# Patient Record
Sex: Female | Born: 1960 | Race: Black or African American | Hispanic: No | Marital: Single | Smoking: Current every day smoker
Health system: Southern US, Community
[De-identification: ages and names within clinical notes are randomized; demographics above are authoritative.]

## PROBLEM LIST (undated history)

## (undated) ENCOUNTER — Emergency Department: Source: Home / Self Care

---

## 2002-12-05 ENCOUNTER — Emergency Department (HOSPITAL_COMMUNITY): Admission: EM | Admit: 2002-12-05 | Discharge: 2002-12-05 | Payer: Self-pay | Admitting: Emergency Medicine

## 2002-12-11 ENCOUNTER — Encounter: Admission: RE | Admit: 2002-12-11 | Discharge: 2002-12-11 | Payer: Self-pay | Admitting: Obstetrics and Gynecology

## 2002-12-12 ENCOUNTER — Encounter (INDEPENDENT_AMBULATORY_CARE_PROVIDER_SITE_OTHER): Payer: Self-pay

## 2002-12-12 ENCOUNTER — Ambulatory Visit (HOSPITAL_COMMUNITY): Admission: AD | Admit: 2002-12-12 | Discharge: 2002-12-12 | Payer: Self-pay | Admitting: Obstetrics and Gynecology

## 2002-12-27 ENCOUNTER — Encounter: Admission: RE | Admit: 2002-12-27 | Discharge: 2002-12-27 | Payer: Self-pay | Admitting: Obstetrics and Gynecology

## 2006-09-21 ENCOUNTER — Emergency Department (HOSPITAL_COMMUNITY): Admission: EM | Admit: 2006-09-21 | Discharge: 2006-09-21 | Payer: Self-pay | Admitting: Emergency Medicine

## 2008-03-28 ENCOUNTER — Ambulatory Visit: Payer: Self-pay | Admitting: Internal Medicine

## 2008-03-29 ENCOUNTER — Ambulatory Visit: Payer: Self-pay | Admitting: *Deleted

## 2010-01-14 ENCOUNTER — Ambulatory Visit: Payer: Self-pay | Admitting: Obstetrics & Gynecology

## 2010-01-14 ENCOUNTER — Other Ambulatory Visit: Admission: RE | Admit: 2010-01-14 | Discharge: 2010-01-14 | Payer: Self-pay | Admitting: Obstetrics & Gynecology

## 2010-01-14 LAB — CONVERTED CEMR LAB
HCT: 45.1 % (ref 36.0–46.0)
Hemoglobin: 14.8 g/dL (ref 12.0–15.0)
MCHC: 32.8 g/dL (ref 30.0–36.0)
RBC: 4.86 M/uL (ref 3.87–5.11)
RDW: 14.4 % (ref 11.5–15.5)
TSH: 0.46 microintl units/mL (ref 0.350–4.500)

## 2010-01-15 ENCOUNTER — Ambulatory Visit: Payer: Self-pay | Admitting: Internal Medicine

## 2010-01-21 ENCOUNTER — Ambulatory Visit (HOSPITAL_COMMUNITY): Admission: RE | Admit: 2010-01-21 | Discharge: 2010-01-21 | Payer: Self-pay | Admitting: Obstetrics & Gynecology

## 2010-01-22 ENCOUNTER — Ambulatory Visit: Payer: Self-pay | Admitting: Internal Medicine

## 2010-01-28 ENCOUNTER — Ambulatory Visit: Payer: Self-pay | Admitting: Internal Medicine

## 2010-02-11 ENCOUNTER — Ambulatory Visit (HOSPITAL_COMMUNITY): Admission: RE | Admit: 2010-02-11 | Discharge: 2010-02-11 | Payer: Self-pay | Admitting: Family Medicine

## 2010-02-11 ENCOUNTER — Ambulatory Visit: Payer: Self-pay | Admitting: Internal Medicine

## 2010-02-11 ENCOUNTER — Encounter (INDEPENDENT_AMBULATORY_CARE_PROVIDER_SITE_OTHER): Payer: Self-pay | Admitting: Family Medicine

## 2010-02-11 LAB — CONVERTED CEMR LAB
Amphetamine Screen, Ur: NEGATIVE
Benzodiazepines.: NEGATIVE
Creatinine,U: 66.2 mg/dL
Marijuana Metabolite: NEGATIVE
Methadone: NEGATIVE
Phencyclidine (PCP): NEGATIVE

## 2010-04-14 ENCOUNTER — Ambulatory Visit: Payer: Self-pay | Admitting: Internal Medicine

## 2010-11-04 ENCOUNTER — Inpatient Hospital Stay (INDEPENDENT_AMBULATORY_CARE_PROVIDER_SITE_OTHER)
Admission: RE | Admit: 2010-11-04 | Discharge: 2010-11-04 | Disposition: A | Discharge status: Home / Self Care | Payer: Self-pay | Source: Ambulatory Visit | Attending: Emergency Medicine | Admitting: Emergency Medicine

## 2010-11-04 DIAGNOSIS — I10 Essential (primary) hypertension: Secondary | ICD-10-CM

## 2010-11-12 ENCOUNTER — Inpatient Hospital Stay (INDEPENDENT_AMBULATORY_CARE_PROVIDER_SITE_OTHER)
Admission: RE | Admit: 2010-11-12 | Discharge: 2010-11-12 | Disposition: A | Discharge status: Home / Self Care | Payer: Self-pay | Source: Ambulatory Visit

## 2010-11-12 DIAGNOSIS — I10 Essential (primary) hypertension: Secondary | ICD-10-CM

## 2010-11-12 DIAGNOSIS — Z760 Encounter for issue of repeat prescription: Secondary | ICD-10-CM

## 2010-11-12 LAB — POCT I-STAT, CHEM 8
BUN: 3 mg/dL — ABNORMAL LOW (ref 6–23)
Chloride: 103 mEq/L (ref 96–112)
Creatinine, Ser: 0.9 mg/dL (ref 0.4–1.2)
Glucose, Bld: 106 mg/dL — ABNORMAL HIGH (ref 70–99)
HCT: 49 % — ABNORMAL HIGH (ref 36.0–46.0)
Hemoglobin: 16.7 g/dL — ABNORMAL HIGH (ref 12.0–15.0)
Potassium: 4.4 mEq/L (ref 3.5–5.1)
Sodium: 139 mEq/L (ref 135–145)
TCO2: 25 mmol/L (ref 0–100)

## 2011-02-05 NOTE — Op Note (Signed)
Monique Key, Monique Key                         ACCOUNT NO.:  [id number]   MEDICAL RECORD NO.:  [id number]                   PATIENT TYPE:  AMB   LOCATION:  [facility]                                 FACILITY:  [facility]   PHYSICIAN:  Phil D. Okey Dupre, M.D.                  DATE OF BIRTH:  10/15/1960   DATE OF PROCEDURE:  12/12/2002  DATE OF DISCHARGE:                                 OPERATIVE REPORT   PROCEDURE:  1. Submucous myomectomy.  2. Uterine curettage.  3. Hysteroscopy.   PREOPERATIVE DIAGNOSES:  Aborting submucous fibroid.   POSTOPERATIVE DIAGNOSES:  Aborting submucous fibroid pending pathology  report.   SURGEON:  Javier Glazier. Okey Dupre, M.D.   OPERATIVE FINDINGS:  A large 4 x 5 x 4 cm aborting fibroid three-quarters  outside of a dilated cervix with a 1 cm pedicle that extended up to the left  lateral uterine wall approximately half-way up towards the fundus.   PROCEDURE:  Under satisfactory general anesthesia with patient in dorsal  lithotomy position, the perineum and vagina prepped and draped in the usual  sterile manner.  Bimanual pelvic examination under anesthesia revealed the  aborting fibroid and a finger could be brought around the entire  circumference to see that the cervix was dilated to about 5 cm.  A weighted  speculum was placed in the posterior fourchette of the vagina and the  aborting fibroid was grasped with a single tooth tenaculum.  Traction was  placed on it.  With that the pedicle could be easily palpated.  Tonsil clamp  was used to clamp the pedicle and the fibroid was then by sharp dissection  removed from the pedicle.  It was soft in consistency and felt degenerated.  When this was done, the __________ was placed on the pedicle and that is how  we found by digital examination where the pedicle implanted.  Pedicle was  then twisted off and the uterine cavity explored with the polyp forceps  followed by exploration with the ring forceps after the uterine cavity  had  been sounded to 8 cm in depth.  The uterine cavity was then vigorously  curetted around the entire circumference with a large sharp curette followed  by a small serrated curette and significant amount of endometrial tissue was  obtained.  Attempt was then made to examine the uterine cavity with a  hysteroscope.  The fundus could be easily seen but because the cervix was  dilated and the uterine cavity dilated so much, it was very difficult to get  enough fluid in there using normal saline to be able to visualize much of  anything else as there was a significant amount of bleeding once we got down  a couple centimeters from the apex.  Vigorous curettage was then continued  until there was very little bleeding coming from the uterine cavity by the  end of the procedure.  The  patient was then transferred to recovery room in  satisfactory condition after the speculum was removed from the vagina.  Specimens sent to pathology was the leiomyomata that was removed and the  uterine curettings.                                               Phil D. Okey Dupre, M.D.    PDR/MEDQ  D:  12/12/2002  T:  12/12/2002  Job:  [id number]

## 2011-03-10 ENCOUNTER — Emergency Department (HOSPITAL_COMMUNITY)
Admission: EM | Admit: 2011-03-10 | Discharge: 2011-03-10 | Disposition: A | Discharge status: Home / Self Care | Payer: Medicaid Other | Attending: Emergency Medicine | Admitting: Emergency Medicine

## 2011-03-10 DIAGNOSIS — J3489 Other specified disorders of nose and nasal sinuses: Secondary | ICD-10-CM | POA: Insufficient documentation

## 2011-03-10 DIAGNOSIS — F411 Generalized anxiety disorder: Secondary | ICD-10-CM | POA: Insufficient documentation

## 2011-03-10 DIAGNOSIS — K047 Periapical abscess without sinus: Secondary | ICD-10-CM | POA: Insufficient documentation

## 2011-03-10 DIAGNOSIS — K089 Disorder of teeth and supporting structures, unspecified: Secondary | ICD-10-CM | POA: Insufficient documentation

## 2011-03-10 DIAGNOSIS — R079 Chest pain, unspecified: Secondary | ICD-10-CM | POA: Insufficient documentation

## 2011-03-10 DIAGNOSIS — I10 Essential (primary) hypertension: Secondary | ICD-10-CM | POA: Insufficient documentation

## 2011-03-10 DIAGNOSIS — K029 Dental caries, unspecified: Secondary | ICD-10-CM | POA: Insufficient documentation

## 2012-03-27 IMAGING — US US PELVIS COMPLETE MODIFY
1 series · 14 of 25 positions shown · non-contrast
Comparison: None.

CLINICAL DATA: Dysfunctional uterine bleeding.  Fibroids, prior
myomectomy.

TRANSABDOMINAL AND TRANSVAGINAL ULTRASOUND OF PELVIS
TECHNIQUE: Both transabdominal and transvaginal ultrasound
examinations of the pelvis were performed including evaluation of
the uterus, ovaries, adnexal regions, and pelvic cul-de-sac.

[Series 1: us pelvis complete modify · 0.24mm/px · 14 of 62 slices shown]
[im 1/62]
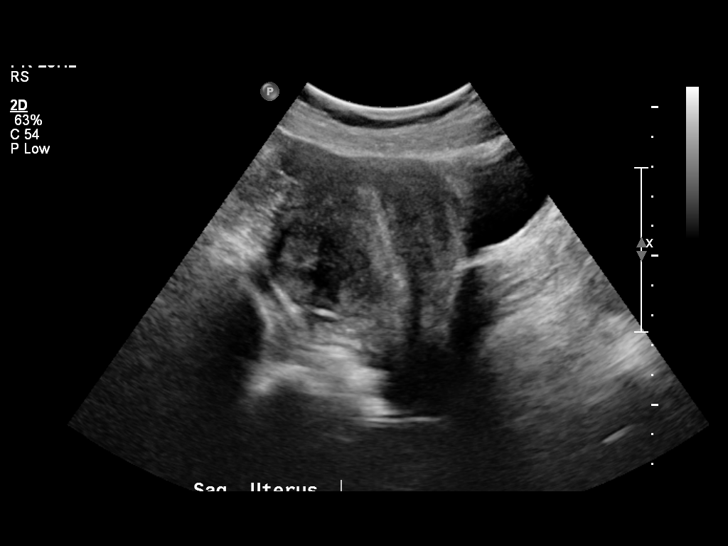
[im 6/62]
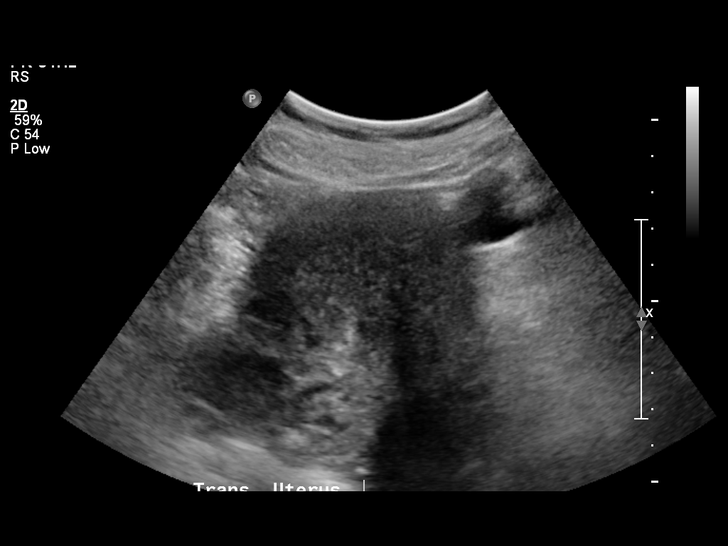
[im 11/62]
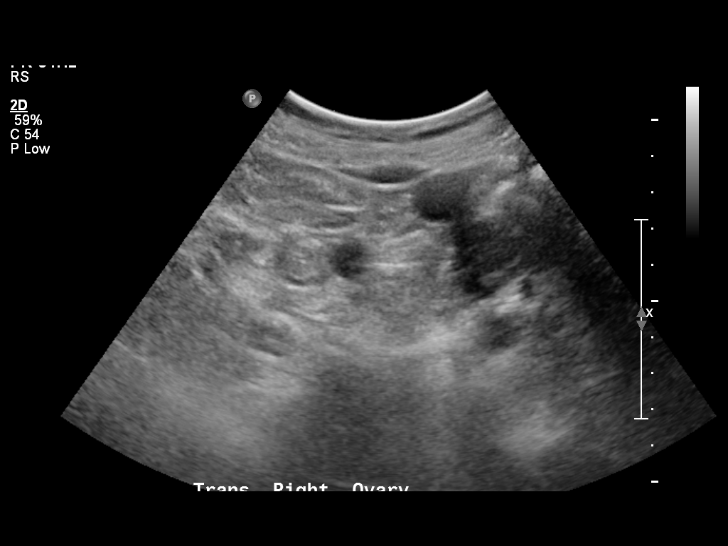
[im 16/62]
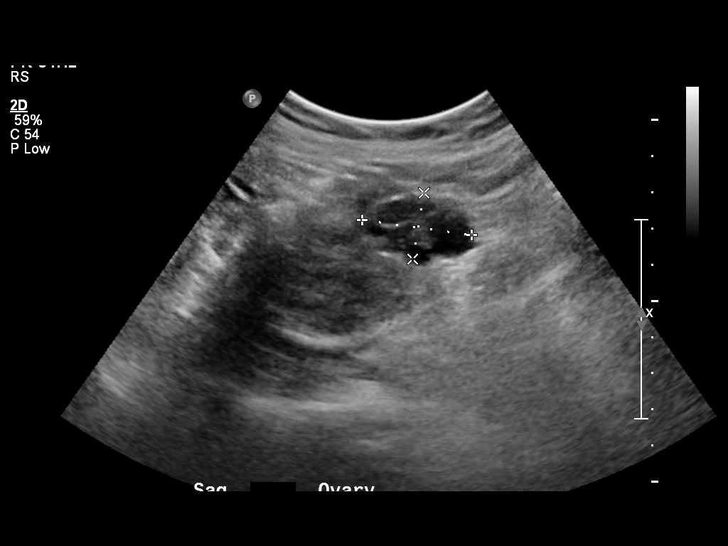
[im 21/62]
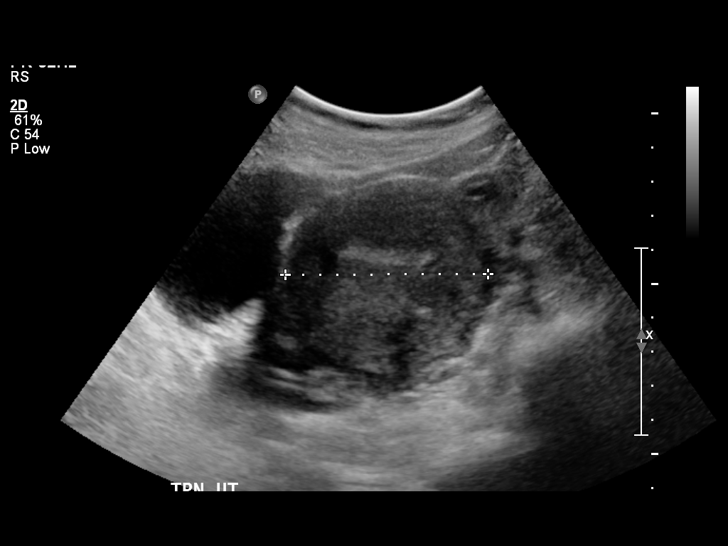
[im 23/62]
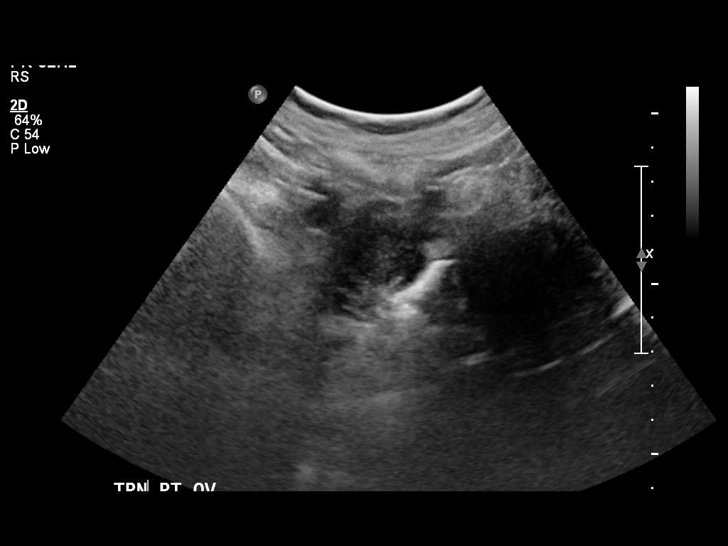
[im 28/62]
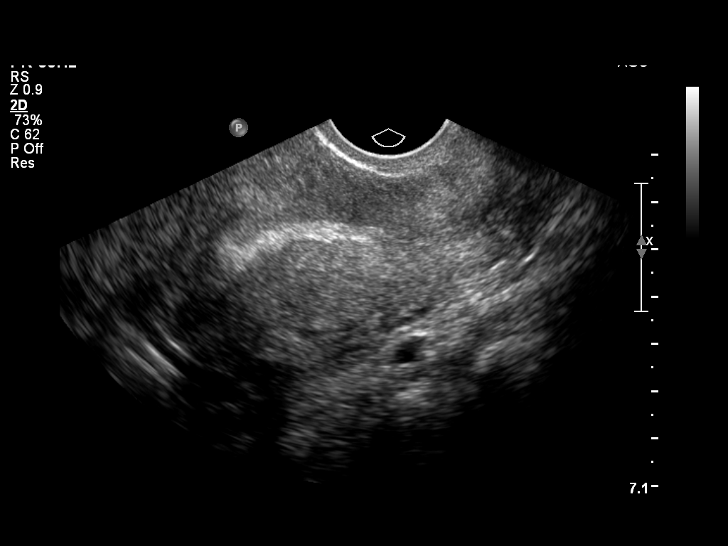
[im 34/62]
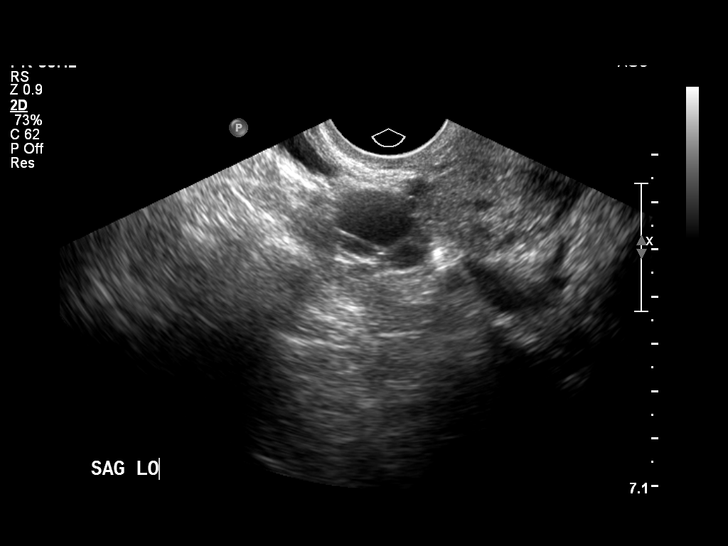
[im 39/62]
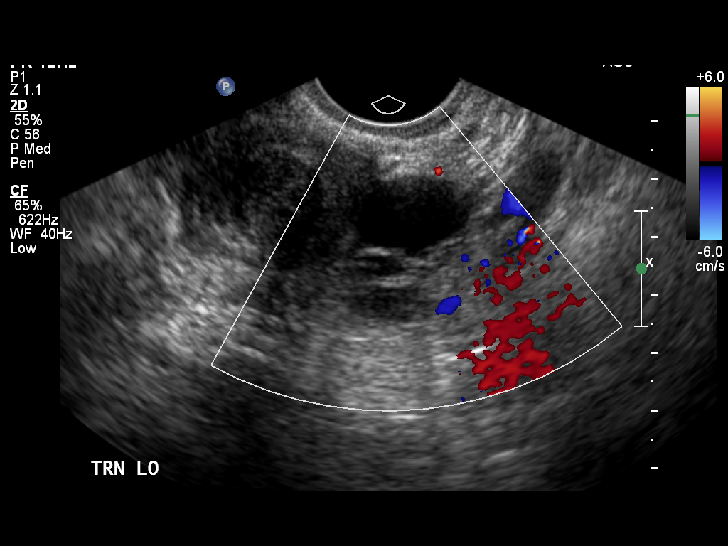
[im 41/62]
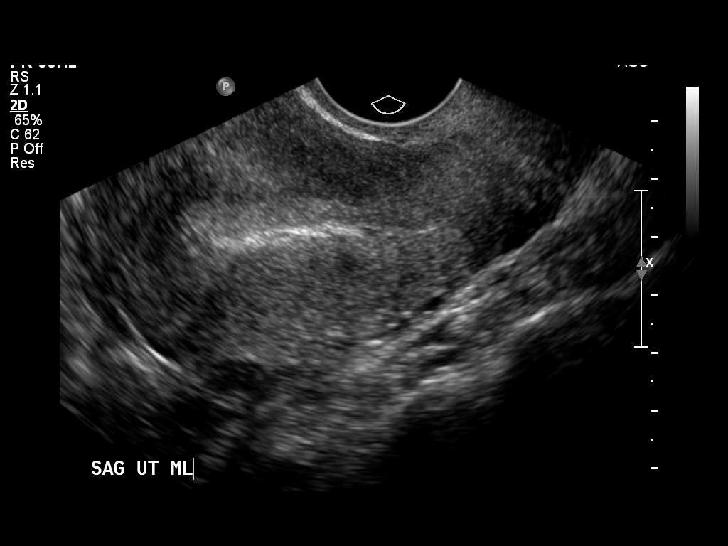
[im 46/62]
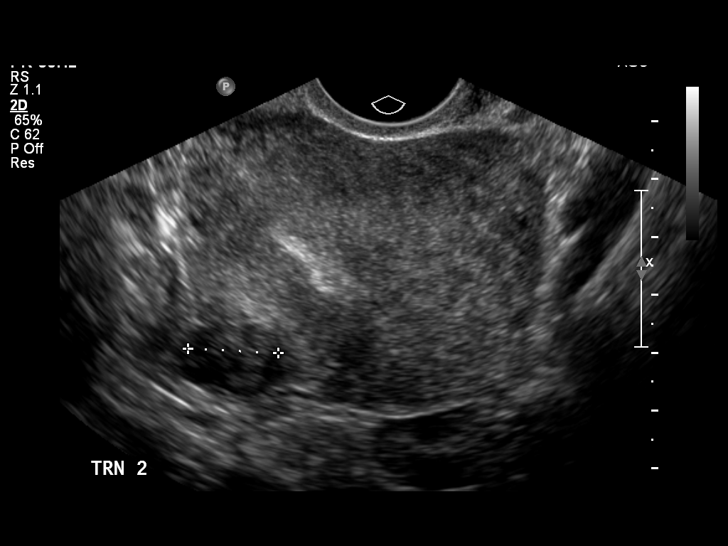
[im 51/62]
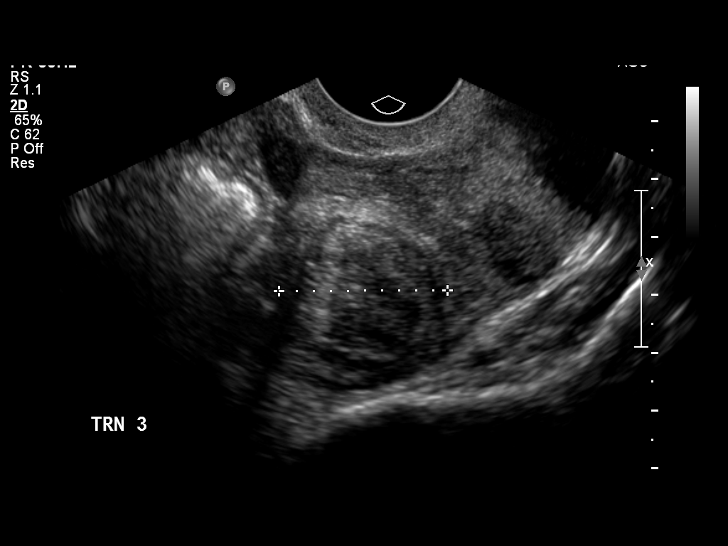
[im 56/62]
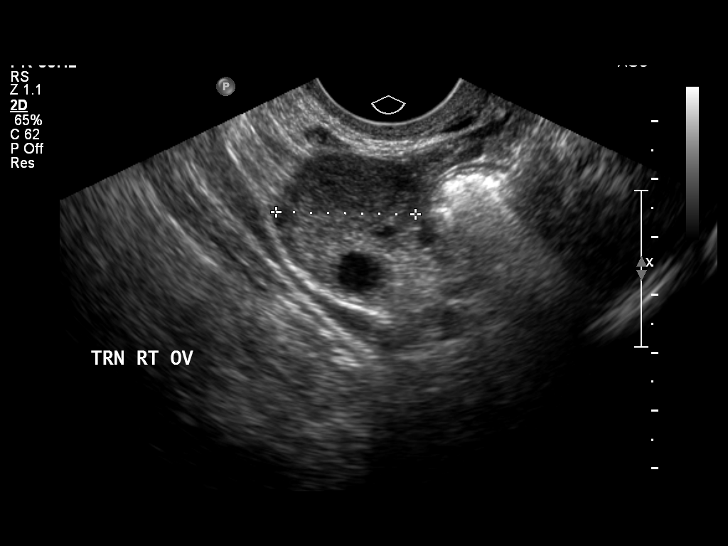
[im 62/62]
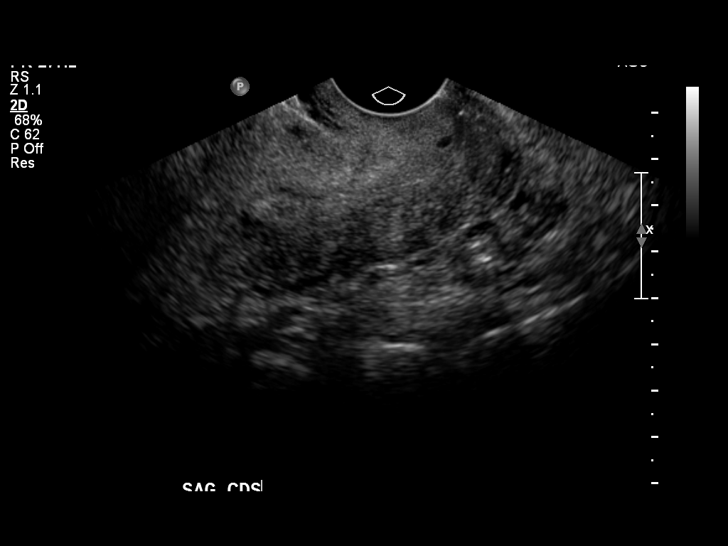

[14 of 25 positions shown; findings below may reference images not displayed]

FINDINGS: Uterus 9.5 x 5.9 x 5.6 cm.  Anteverted, anteflexed.  Probable
fibroid noted as follows:

Left lateral uterine fundus, partly subserosal / partly intramural,
1.8 x 1.8 x 1.5 cm

Right posterior uterine fundus, partly subserosal / partly
intramural, 1.6 x 1.3 x 1.2 cm

Right lateral uterine body, partly intramural, displacing the
endometrial stripe anteriorly, 3.4 x 2.9 x 2.8 cm.

Endometrium 8 mm, uniformly echogenic where visualized.

Right Ovary 3.5 x 2.8 x 2.4 cm.  Normal.

Left Ovary 2.5 x 1.9 x 1.6 cm.  Normal.

Other Findings:  No free fluid.
IMPRESSION: Fibroid uterus as above.  Largest in the right posterolateral
uterine body displaces the endometrial stripe anteriorly.

## 2021-01-17 DIAGNOSIS — F1020 Alcohol dependence, uncomplicated: Secondary | ICD-10-CM | POA: Insufficient documentation

## 2021-01-19 DIAGNOSIS — N3000 Acute cystitis without hematuria: Secondary | ICD-10-CM | POA: Insufficient documentation

## 2021-01-19 DIAGNOSIS — F1210 Cannabis abuse, uncomplicated: Secondary | ICD-10-CM | POA: Insufficient documentation

## 2021-01-19 DIAGNOSIS — F1010 Alcohol abuse, uncomplicated: Secondary | ICD-10-CM | POA: Insufficient documentation

## 2021-01-19 DIAGNOSIS — F1420 Cocaine dependence, uncomplicated: Secondary | ICD-10-CM | POA: Insufficient documentation

## 2022-02-23 ENCOUNTER — Ambulatory Visit (INDEPENDENT_AMBULATORY_CARE_PROVIDER_SITE_OTHER): Payer: Medicaid Other | Admitting: Podiatry

## 2022-02-23 ENCOUNTER — Ambulatory Visit (INDEPENDENT_AMBULATORY_CARE_PROVIDER_SITE_OTHER): Payer: Medicaid Other

## 2022-02-23 ENCOUNTER — Encounter: Payer: Self-pay | Admitting: Podiatry

## 2022-02-23 DIAGNOSIS — M722 Plantar fascial fibromatosis: Secondary | ICD-10-CM | POA: Diagnosis not present

## 2022-02-23 NOTE — Progress Notes (Signed)
  Subjective:  Patient ID: Monique Key, female    DOB: 1961-09-12,  MRN: [id number]  Chief Complaint  Patient presents with   Foot Pain    I have some lumps on both the arches of my feet    61 y.o. female presents with the above complaint. History confirmed with patient.   Objective:  Physical Exam: warm, good capillary refill, no trophic changes or ulcerative lesions, normal DP and PT pulses, normal sensory exam, and bilaterally she has golf ball sized plantar fibromas in the medial band of the plantar fascia of both feet.   Radiographs: Multiple views x-ray of both feet: no fracture, dislocation, swelling or degenerative changes noted Assessment:   1. Plantar fibromatosis      Plan:  Patient was evaluated and treated and all questions answered.  Discussed etiology treatment options of plantar fibromatosis including injection therapy and excision.  We discussed the risk of recurrence with either treatment option.  I recommended corticosteroid injection.  Today following sterile prep with alcohol the bilateral fibromas were injected with 1 cc of dexamethasone phosphate 0.5 cc each of 2% lidocaine and 0.5% Marcaine plain.  She tolerated the procedures well.  I will see her back as needed cautioned on the risk of recurrence we could discuss further injections or surgical resection at that point.  Return if symptoms worsen or fail to improve.

## 2022-02-25 ENCOUNTER — Telehealth: Payer: Self-pay | Admitting: *Deleted

## 2022-02-25 NOTE — Telephone Encounter (Addendum)
Patient is calling for recommendations on inserts that medicaid would pay, cannot afford any out of pocket expenses. She is also wanting detail summary of the visit. She will pick up at front desk.

## 2022-02-25 NOTE — Telephone Encounter (Signed)
We discussed earlier but Superfeet, Powerstep and ProTalus are all good brands

## 2022-02-26 NOTE — Telephone Encounter (Signed)
Called patient giving her orthotics recommendation by physician, verbalized understanding but wanted recommended orthotics mailed to her address along with last visit summary.  Mailed on 02/26/22.

## 2022-04-13 ENCOUNTER — Ambulatory Visit (INDEPENDENT_AMBULATORY_CARE_PROVIDER_SITE_OTHER): Payer: Medicaid Other | Admitting: Podiatry

## 2022-04-13 DIAGNOSIS — Z720 Tobacco use: Secondary | ICD-10-CM | POA: Diagnosis not present

## 2022-04-13 DIAGNOSIS — M722 Plantar fascial fibromatosis: Secondary | ICD-10-CM

## 2022-04-13 NOTE — Progress Notes (Signed)
  Subjective:  Patient ID: Monique Key, female    DOB: February 22, 1961,  MRN: [id number]  Chief Complaint  Patient presents with   Foot Problem     WANTS SURGERY ON BILATERAL FEET. PATIENT STATED FEELS LIKE KNIVES WHEN WALKING. pAIN IS LOCATED AT THE BOTTOM OF FEET     61 y.o. female presents with the above complaint. History confirmed with patient.  Injection was not helpful only lasted for a day or 2 and then the pain returned  Objective:  Physical Exam: warm, good capillary refill, no trophic changes or ulcerative lesions, normal DP and PT pulses, normal sensory exam, and bilaterally she has golf ball sized plantar fibromas in the medial band of the plantar fascia of both feet.   Radiographs: Multiple views x-ray of both feet: no fracture, dislocation, swelling or degenerative changes noted Assessment:   1. Plantar fibromatosis   2. Tobacco use       Plan:  Patient was evaluated and treated and all questions answered.  We can discuss further treatment options.  At this point injection therapy has not helped.  We discussed surgical excision.  We discussed the risk benefits and potential complications of the procedure including not limited to  pain, swelling, infection, scar, numbness which may be temporary or permanent, chronic pain, stiffness, nerve pain or damage, wound healing problems.  All questions were addressed.  Informed consent was signed and reviewed.  No guarantees to the outcome of surgery were made.  I did recommend an MRI for surgical planning for the fibroma excisions prior to surgery.  This was ordered from [facility] imaging and she will schedule..  She does smoke approximately 5 cigarettes a day we discussed the impact of smoking on wound healing and the possibility of a thickened hypertrophic painful scar or wound dehiscence.  She will reduce and try to cease her smoking after surgery.  I discussed with her these risks and again and she will work on  this.    Surgical plan:  Procedure: -Fibroma excision left foot  Location: -[facility]  Anesthesia plan: -IV sedation with local block  Postoperative pain plan: - Tylenol 1000 mg every 6 hours, ibuprofen 600 mg every 6 hours, gabapentin 300 mg every 8 hours x5 days, oxycodone 5 mg 1-2 tabs every 6 hours only as needed  DVT prophylaxis: -None required  WB Restrictions / DME needs: -NWB in CAM boot   No follow-ups on file.

## 2022-04-13 NOTE — Patient Instructions (Signed)
Call [facility] to schedule your MRI at the below locations.  Please allow at least 1 business day after your visit to process the referral.  It may take longer depending on approval from insurance.  Please let me know if you have issues or problems scheduling the MRI     [facility] [place] [phone] [address] [place], [place] [address]  

## 2022-04-15 ENCOUNTER — Telehealth: Payer: Self-pay | Admitting: Urology

## 2022-04-15 NOTE — Telephone Encounter (Signed)
DOS - 04/23/22  PLANTAR FIBROMA LEFT --- [id number]  [employer] EFFECTIVE DATE - 03/20/22  PLAN DEDUCTIBLE - $0.00 OUT OF POCKET - Member's individual out-of-pocket maximum has no limit. COINSURANCE - 0% COPAY - $0.00  PER [facility] SITE FOR CPT CODE 71855 HAS BEEN APPROVED, AUTH # [id number], GOOD FROM 04/23/22 - 07/22/22.

## 2022-04-21 ENCOUNTER — Ambulatory Visit
Admission: RE | Admit: 2022-04-21 | Discharge: 2022-04-21 | Disposition: A | Discharge status: Home / Self Care | Payer: Medicaid Other | Source: Ambulatory Visit | Attending: Podiatry | Admitting: Podiatry

## 2022-04-21 DIAGNOSIS — M722 Plantar fascial fibromatosis: Secondary | ICD-10-CM

## 2022-04-21 MED ORDER — DIPHENHYDRAMINE HCL 50 MG/ML IJ SOLN
25.0000 mg | Freq: Once | INTRAMUSCULAR | Status: AC
  Administered 2022-04-21: 25 mg via INTRAVENOUS

## 2022-04-21 MED ORDER — GADOBENATE DIMEGLUMINE 529 MG/ML IV SOLN
15.0000 mL | Freq: Once | INTRAVENOUS | Status: AC | PRN
  Administered 2022-04-21: 15 mL via INTRAVENOUS

## 2022-04-21 MED ORDER — DIPHENHYDRAMINE HCL 25 MG PO CAPS
25.0000 mg | ORAL_CAPSULE | Freq: Once | ORAL | Status: AC
Start: 2022-04-21 — End: 2022-04-21
  Administered 2022-04-21: 25 mg via ORAL

## 2022-04-21 NOTE — Progress Notes (Signed)
Pt was having MRI scan and reported severe itching after contrast administration. MRI tech notified Dr. Karin Golden who gave the pt 25 mg oral benadryl. Pt continued scan and post scan reported she was still itching. Pt was brought over to the nurses station, Dr. Karin Golden notified of the patient continuing to itch and ordered an additional 25 mg mg of IV benadryl to be given. 3 minutes after IV benadryl was given the patient stated "I have to go", "my ride is here". I advised the patient that we would like to monitor her to ensure the reaction got better and no worse, pt insisted on leaving. Explained to the patient if her symptoms do not improve or they get worse she needs to seek urgent medical attention, pt verbalized understanding. Dr Karin Golden was notified.

## 2022-04-23 ENCOUNTER — Other Ambulatory Visit: Payer: Self-pay | Admitting: Podiatry

## 2022-04-23 DIAGNOSIS — M722 Plantar fascial fibromatosis: Secondary | ICD-10-CM

## 2022-04-23 MED ORDER — HYDROCODONE-ACETAMINOPHEN 5-325 MG PO TABS: 1.0000 | ORAL_TABLET | ORAL | 0 refills | Status: DC | PRN

## 2022-04-23 NOTE — Progress Notes (Signed)
04/23/22 fibroma excision

## 2022-04-24 ENCOUNTER — Other Ambulatory Visit: Payer: Self-pay | Admitting: Podiatry

## 2022-04-24 MED ORDER — OXYCODONE-ACETAMINOPHEN 5-325 MG PO TABS: 1.0000 | ORAL_TABLET | ORAL | 0 refills | Status: DC | PRN

## 2022-04-26 ENCOUNTER — Telehealth: Payer: Self-pay

## 2022-04-26 ENCOUNTER — Telehealth: Payer: Self-pay | Admitting: Podiatry

## 2022-04-26 MED ORDER — HYDROCODONE-ACETAMINOPHEN 10-325 MG PO TABS: 1.0000 | ORAL_TABLET | ORAL | 0 refills | Status: AC | PRN

## 2022-04-26 MED ORDER — GABAPENTIN 300 MG PO CAPS: 300.0000 mg | ORAL_CAPSULE | Freq: Three times a day (TID) | ORAL | 0 refills | Status: DC

## 2022-04-26 NOTE — Telephone Encounter (Signed)
Patient called and lvm on general mailbox stating that she is in severe pain and she needs stronger medication sent to her pharmacy

## 2022-04-28 NOTE — Telephone Encounter (Signed)
Patient is aware 

## 2022-05-03 ENCOUNTER — Telehealth: Payer: Self-pay | Admitting: *Deleted

## 2022-05-03 ENCOUNTER — Ambulatory Visit (INDEPENDENT_AMBULATORY_CARE_PROVIDER_SITE_OTHER): Payer: Medicaid Other | Admitting: Podiatry

## 2022-05-03 DIAGNOSIS — M722 Plantar fascial fibromatosis: Secondary | ICD-10-CM

## 2022-05-03 MED ORDER — HYDROCODONE-ACETAMINOPHEN 10-325 MG PO TABS: 1.0000 | ORAL_TABLET | ORAL | 0 refills | Status: DC | PRN

## 2022-05-03 NOTE — Telephone Encounter (Signed)
Patient called and said that the wrong medication was sent to pharmacy, was supposed to be Oxycodone-10,not the Norco, 10-325 mg. Please advise.

## 2022-05-04 MED ORDER — OXYCODONE-ACETAMINOPHEN 10-325 MG PO TABS: 1.0000 | ORAL_TABLET | ORAL | 0 refills | Status: AC | PRN

## 2022-05-04 MED ORDER — OXYCODONE-ACETAMINOPHEN 10-325 MG PO TABS: 1.0000 | ORAL_TABLET | ORAL | 0 refills | Status: DC | PRN

## 2022-05-04 NOTE — Telephone Encounter (Signed)
Patient notified

## 2022-05-04 NOTE — Telephone Encounter (Signed)
Patient called back and stated that [facility] has been out of the Percocet for 3 months now. She asked could the Medication be sent to the [facility] on [address].   Pharmacy has been added to the chart.

## 2022-05-05 NOTE — Progress Notes (Signed)
  Subjective:  Patient ID: Monique Key, female    DOB: 1961/01/28,  MRN: [id number]  Chief Complaint  Patient presents with   Routine Post Op    POV #1 DOS 04/23/2022 REMOVAL FIBROMA LT FOOT  Patient reports pain 8 out of 10, taking Rx pain meds q4hrs    61 y.o. female returns for post-op check.  She still having quite a bit of pain says both feet hurt and itch  Review of Systems: Negative except as noted in the HPI. Denies N/V/F/Ch.   Objective:  There were no vitals filed for this visit. There is no height or weight on file to calculate BMI. Constitutional Well developed. Well nourished.  Vascular Foot warm and well perfused. Capillary refill normal to all digits.  Calf is soft and supple, no posterior calf or knee pain, negative Homans' sign  Neurologic Normal speech. Oriented to person, place, and time. Epicritic sensation to light touch grossly present bilaterally.  Dermatologic Skin healing well without signs of infection. Skin edges well coapted without signs of infection.  Orthopedic: Tenderness to palpation noted about the surgical site.    Assessment:   1. Plantar fibromatosis    Plan:  Patient was evaluated and treated and all questions answered.  S/p foot surgery left -Progressing as expected post-operatively.  Her pain seems more consistent with her peripheral neuropathy than it was needed postoperative pain as it is bilateral and nonspecific -WB Status: NWB in CAM boot -Sutures: Plan remove in 2 weeks. -Medications: Refill of pain medication sent to pharmacy -Foot redressed.  No follow-ups on file.

## 2022-05-10 ENCOUNTER — Telehealth: Payer: Self-pay | Admitting: Podiatry

## 2022-05-10 NOTE — Telephone Encounter (Signed)
Patient called and stated she is in severe pain since her sx. Her pain medication is not even touching the pain. She states she is taking the pain meds 4 times a day and she has not been on her feet since sx.

## 2022-05-11 MED ORDER — GABAPENTIN 300 MG PO CAPS: 300.0000 mg | ORAL_CAPSULE | Freq: Three times a day (TID) | ORAL | 0 refills | Status: DC

## 2022-05-11 MED ORDER — OXYCODONE-ACETAMINOPHEN 10-325 MG PO TABS: 1.0000 | ORAL_TABLET | ORAL | 0 refills | Status: AC | PRN

## 2022-05-11 NOTE — Telephone Encounter (Signed)
Patient is aware and verbalized understanding of information given. 

## 2022-05-17 ENCOUNTER — Encounter: Payer: Self-pay | Admitting: Podiatry

## 2022-05-17 ENCOUNTER — Other Ambulatory Visit: Payer: Self-pay | Admitting: Podiatry

## 2022-05-17 ENCOUNTER — Ambulatory Visit (INDEPENDENT_AMBULATORY_CARE_PROVIDER_SITE_OTHER): Payer: Medicaid Other | Admitting: Podiatry

## 2022-05-17 DIAGNOSIS — Z720 Tobacco use: Secondary | ICD-10-CM

## 2022-05-17 DIAGNOSIS — M722 Plantar fascial fibromatosis: Secondary | ICD-10-CM

## 2022-05-17 MED ORDER — OXYCODONE-ACETAMINOPHEN 10-325 MG PO TABS: 1.0000 | ORAL_TABLET | ORAL | 0 refills | Status: AC | PRN

## 2022-05-17 MED ORDER — GABAPENTIN 300 MG PO CAPS: 300.0000 mg | ORAL_CAPSULE | Freq: Three times a day (TID) | ORAL | 0 refills | Status: DC

## 2022-05-17 NOTE — Progress Notes (Signed)
Patient calling because unable to get pain medication today from pharmacy as insurance will not cover any more. Did refill gabapentin for her and she will call insurance tomorrow to sort out.

## 2022-05-17 NOTE — Progress Notes (Signed)
  Subjective:  Patient ID: Monique Key, female    DOB: [birth year],  MRN: [id number]  Chief Complaint  Patient presents with   Routine Post Op    POV #2 DOS 04/23/2022 REMOVAL FIBROMA LT FOOT- Sutures are intact. Patient complaining of pain. Patient is taking Oxycodone and Gabapentin as ordered. Patient is elevating and icing.     61 y.o. female returns for post-op check.  She says it is still painful for her  Review of Systems: Negative except as noted in the HPI. Denies N/V/F/Ch.   Objective:  There were no vitals filed for this visit. There is no height or weight on file to calculate BMI. Constitutional Well developed. Well nourished.  Vascular Foot warm and well perfused. Capillary refill normal to all digits.  Calf is soft and supple, no posterior calf or knee pain, negative Homans' sign  Neurologic Normal speech. Oriented to person, place, and time. Epicritic sensation to light touch grossly present bilaterally.  Dermatologic Incision is well coapted.  Slight hypertrophy.  Some bruising about the incision  Orthopedic: Tenderness to palpation noted about the surgical site.    Assessment:   1. Plantar fibromatosis   2. Tobacco use    Plan:  Patient was evaluated and treated and all questions answered.  S/p foot surgery left I discussed with her that she should still have remain nonweightbearing which she has not done she is WBAT in the cam boot when she enters the office today.  She says she has been at home and not really done much.  We also discussed the impact of smoking.  Her sutures were removed uneventfully and the incision remained well coapted.  I think this will take some time to heal to 100%.  I did refill her pain medication.  She may begin bathing and applying a lotion or cream to the incision.   Return in about 2 weeks (around 05/31/2022) for post op (no x-rays).

## 2022-05-25 ENCOUNTER — Other Ambulatory Visit: Payer: Self-pay | Admitting: Podiatry

## 2022-05-26 ENCOUNTER — Other Ambulatory Visit: Payer: Self-pay | Admitting: Podiatry

## 2022-05-26 NOTE — Telephone Encounter (Signed)
Pt called & requested a Rx refill on the Oxycodone; she stated she is still in a lot of pain. Please advise

## 2022-05-27 ENCOUNTER — Telehealth: Payer: Self-pay | Admitting: Podiatry

## 2022-05-27 MED ORDER — OXYCODONE-ACETAMINOPHEN 10-325 MG PO TABS: 1.0000 | ORAL_TABLET | Freq: Three times a day (TID) | ORAL | 0 refills | Status: DC | PRN

## 2022-05-27 NOTE — Telephone Encounter (Unsigned)
Patient is calling to request pain (oxycodone-ace, 10-325 mg)medicine, she is also concerned about the swelling.Please advise.  Spoke with patient and encouraged her to elevate as much as possible,continuing icing(explained that ice pack behind the knee may help).

## 2022-05-27 NOTE — Telephone Encounter (Signed)
Pt called stating she has called all morning and is in a lot of pain and is needing some pain medication called in. She said that the nurse told her to put ice behind her knee and that is not helping. I did see that there was an issue with insurance company getting pain medication earlier this week but pt stated she has got that taken care of and needs some pain medication sent in. Pharmacy in chart is correct.

## 2022-05-28 NOTE — Telephone Encounter (Signed)
Spoke to the patient and she is picking up her medication today.

## 2022-05-31 ENCOUNTER — Encounter: Payer: Medicaid Other | Admitting: Podiatry

## 2022-06-03 ENCOUNTER — Ambulatory Visit (INDEPENDENT_AMBULATORY_CARE_PROVIDER_SITE_OTHER): Payer: Medicaid Other | Admitting: Podiatry

## 2022-06-03 DIAGNOSIS — M722 Plantar fascial fibromatosis: Secondary | ICD-10-CM

## 2022-06-03 DIAGNOSIS — G5772 Causalgia of left lower limb: Secondary | ICD-10-CM

## 2022-06-03 MED ORDER — OXYCODONE-ACETAMINOPHEN 10-325 MG PO TABS: 1.0000 | ORAL_TABLET | Freq: Four times a day (QID) | ORAL | 0 refills | Status: AC | PRN

## 2022-06-03 MED ORDER — GABAPENTIN 300 MG PO CAPS: ORAL_CAPSULE | ORAL | 0 refills | Status: DC

## 2022-06-03 MED ORDER — LIDOCAINE 4 % EX PTCH: 1.0000 | MEDICATED_PATCH | CUTANEOUS | 0 refills | Status: DC

## 2022-06-03 NOTE — Progress Notes (Signed)
  Subjective:  Patient ID: Monique Key, female    DOB: 12-Nov-1960,  MRN: [id number]  Chief Complaint  Patient presents with   Routine Post Op    POV #3 DOS 04/23/2022 REMOVAL FIBROMA LT FOOT. Overall patient states that she is still in pain. Patient states her foot has been swollen as well for the last couple of days. She has iced and elevated her foot as much as possible.     61 y.o. female returns for post-op check.  Still very painful she not been able to walk on it.  She is having pain on the top of the foot as well.  The right foot is occasionally painful.  The tips of the fingers feel numb  Review of Systems: Negative except as noted in the HPI. Denies N/V/F/Ch.   Objective:  There were no vitals filed for this visit. There is no height or weight on file to calculate BMI. Constitutional Well developed. Well nourished.  Vascular Foot warm and well perfused. Capillary refill normal to all digits.  Calf is soft and supple, no posterior calf or knee pain, negative Homans' sign  Neurologic Normal speech. Oriented to person, place, and time. Epicritic sensation to light touch grossly present bilaterally.  Dermatologic Incision is well coapted.  Slight hypertrophy.  Some bruising about the incision  Orthopedic: Tenderness to palpation noted about the surgical site.  Somewhat out of proportion.  She has pain on the top of the feet as well    Assessment:   1. Plantar fibromatosis   2. 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  S/p foot surgery left Unfortunately continues to be very painful for her.  She has significant pain that seems to be out of proportion to her surgery at this point.  I recommend evaluation for CRPS.  A referral was sent to the pain clinic.  I refilled her pain medications, increased her gabapentin to 300/300/600 mg and prescribe Lidoderm patches.  That she may put  these on around and proximal to the incisions for some relief.  Discussed she should continue to bathe the foot normally and apply lotion to soften the incision which is slightly hypertrophic   No follow-ups on file.

## 2022-06-04 ENCOUNTER — Telehealth: Payer: Self-pay | Admitting: Podiatry

## 2022-06-04 ENCOUNTER — Telehealth: Payer: Self-pay

## 2022-06-04 NOTE — Telephone Encounter (Signed)
Pt called and pts daughter picked up her prescriptions and they only have 2. They did not get her rx for the patches. It does show it was received by pharmacy yesterday at 714pm. I sent it to the nurse to see if she could resend it.

## 2022-06-04 NOTE — Telephone Encounter (Signed)
I have already spoken to the patient and everything has been addressed for the patient.

## 2022-06-04 NOTE — Progress Notes (Signed)
Referral was faxed over to the deparetment

## 2022-06-08 ENCOUNTER — Encounter: Payer: Medicaid Other | Admitting: Podiatry

## 2022-06-14 ENCOUNTER — Telehealth: Payer: Self-pay | Admitting: Podiatry

## 2022-06-14 MED ORDER — OXYCODONE-ACETAMINOPHEN 10-325 MG PO TABS: 1.0000 | ORAL_TABLET | ORAL | 0 refills | Status: DC | PRN

## 2022-06-14 MED ORDER — LIDOCAINE 5 % EX OINT: 1.0000 | TOPICAL_OINTMENT | CUTANEOUS | 0 refills | Status: DC | PRN

## 2022-06-14 NOTE — Telephone Encounter (Signed)
Pt called and stated that she is in a lot of pain and can't sleep at night. The pain wakes her up and she would like to refill her Rx for pain medicine. She uses the [facility] on [address]. Please advise.

## 2022-06-14 NOTE — Telephone Encounter (Signed)
Spoke with patient to let her know that the pain medicine has been sent to pharmacy on file, she verbalized understanding, said that the lidocaine patches cost her 13 OTC( only gets a ck once monthly) is only helping a little with pain.

## 2022-06-14 NOTE — Telephone Encounter (Signed)
Pt called again stated that she is in a lot of pain and can't sleep at night. Dr Sherryle Lis sent her a RX But she stated she cant afford it. I sent her to a Nurse to help advise her on her pain. Pt has an appointment on 9/28.  Please advise

## 2022-06-14 NOTE — Telephone Encounter (Signed)
Please advise of something else for her pain- (oxycodone-ace, 10-325 mg) has been using OTC equite pain medicine- 500 mg, not helping, cannot put shoe on foot, looks like it may be infected, it is painful, cannot wait until upcoming appointment on the 28 th.

## 2022-06-14 NOTE — Telephone Encounter (Signed)
This encounter has been addressed by provider in [place] encounter

## 2022-06-14 NOTE — Telephone Encounter (Signed)
Addressed in separate encounter.

## 2022-06-17 ENCOUNTER — Ambulatory Visit (INDEPENDENT_AMBULATORY_CARE_PROVIDER_SITE_OTHER): Payer: Medicaid Other | Admitting: Podiatry

## 2022-06-17 ENCOUNTER — Encounter: Payer: Self-pay | Admitting: Physical Medicine & Rehabilitation

## 2022-06-17 DIAGNOSIS — M722 Plantar fascial fibromatosis: Secondary | ICD-10-CM

## 2022-06-17 MED ORDER — OXYCODONE-ACETAMINOPHEN 10-325 MG PO TABS: 1.0000 | ORAL_TABLET | ORAL | 0 refills | Status: AC | PRN

## 2022-06-17 NOTE — Progress Notes (Signed)
  Subjective:  Patient ID: Monique Key, female    DOB: August 15, 1961,  MRN: [id number]  Chief Complaint  Patient presents with   Routine Post Op     POV #3 DOS 04/23/2022 REMOVAL FIBROMA LT FOOT    61 y.o. female returns for post-op check.  Continues to still be quite painful  Review of Systems: Negative except as noted in the HPI. Denies N/V/F/Ch.   Objective:  There were no vitals filed for this visit. There is no height or weight on file to calculate BMI. Constitutional Well developed. Well nourished.  Vascular Foot warm and well perfused. Capillary refill normal to all digits.  Calf is soft and supple, no posterior calf or knee pain, negative Homans' sign  Neurologic Normal speech. Oriented to person, place, and time. Epicritic sensation to light touch grossly present bilaterally.  No notable allodynia  Dermatologic Overall has had delayed healing of her incision that is led to hypertrophy of the scar  Orthopedic: Tenderness to palpation noted about the surgical site.  Somewhat out of proportion.  She has pain on the top of the feet as well    Assessment:   1. Plantar fibromatosis     Plan:  Patient was evaluated and treated and all questions answered.  S/p foot surgery left Continues to be quite painful.  Pain clinic eval is pending.  She may increase gabapentin Rx to 600/600/900, continue Percocet as needed.  Refill of this was sent today to be filled on 06/19/2022.  Continue lidocaine ointment.  I think her smoking history has negatively impacted her scar healing and this will take some time to resolve fully   Return in about 4 weeks (around 07/15/2022) for post op (no x-rays).

## 2022-06-17 NOTE — Telephone Encounter (Signed)
Patient is using RX ointment and it seems to working, has scheduled pain management -Oct 19.

## 2022-06-17 NOTE — Progress Notes (Signed)
Just spoke to Pain management and she was the next referral that was on top of the referral coordinators stack. She is getting ready to call her to schedule her appt.

## 2022-06-29 ENCOUNTER — Telehealth: Payer: Self-pay | Admitting: Podiatry

## 2022-06-29 MED ORDER — OXYCODONE-ACETAMINOPHEN 10-325 MG PO TABS: 1.0000 | ORAL_TABLET | ORAL | 0 refills | Status: AC | PRN

## 2022-06-29 MED ORDER — CLOBETASOL PROPIONATE 0.05 % EX CREA: 1.0000 | TOPICAL_CREAM | Freq: Two times a day (BID) | CUTANEOUS | 0 refills | Status: DC

## 2022-06-29 NOTE — Telephone Encounter (Signed)
Patient would like a refill on pain medication.  Monique Key wanted to know if there is something that she can put on the bottom of foot for itching

## 2022-06-29 NOTE — Telephone Encounter (Signed)
Notified patient.

## 2022-06-29 NOTE — Telephone Encounter (Signed)
Patient called back to follow up on refill medication

## 2022-07-06 ENCOUNTER — Ambulatory Visit: Payer: Medicaid Other | Admitting: Podiatry

## 2022-07-07 ENCOUNTER — Other Ambulatory Visit: Payer: Self-pay | Admitting: Podiatry

## 2022-07-08 ENCOUNTER — Encounter: Payer: Medicaid Other | Admitting: Physical Medicine & Rehabilitation

## 2022-07-12 ENCOUNTER — Telehealth: Payer: Self-pay | Admitting: Podiatry

## 2022-07-12 MED ORDER — OXYCODONE-ACETAMINOPHEN 10-325 MG PO TABS: 1.0000 | ORAL_TABLET | ORAL | 0 refills | Status: AC | PRN

## 2022-07-12 NOTE — Telephone Encounter (Signed)
Pt called in states that she needs a refill on her RX's for Gabapentin &Oxycodone. She is still experiencing numbness & pain is shooting to the tip of toes. Her appt at the pain clinic is not until 10/30. Please advise.

## 2022-07-13 ENCOUNTER — Other Ambulatory Visit: Payer: Self-pay | Admitting: Family Medicine

## 2022-07-15 ENCOUNTER — Ambulatory Visit (INDEPENDENT_AMBULATORY_CARE_PROVIDER_SITE_OTHER): Payer: Medicaid Other | Admitting: Podiatry

## 2022-07-15 DIAGNOSIS — M722 Plantar fascial fibromatosis: Secondary | ICD-10-CM

## 2022-07-19 NOTE — Progress Notes (Signed)
  Subjective:  Patient ID: Monique Key, female    DOB: 12-10-1960,  MRN: [id number]  Chief Complaint  Patient presents with   Routine Post Op     4 weeks (around 07/15/2022) for post op (no x-rays). POV #4 DOS 04/23/2022 REMOVAL FIBROMA LT FOOT    61 y.o. female returns for post-op check.  She says it is starting to her corner the scab is getting thinner Review of Systems: Negative except as noted in the HPI. Denies N/V/F/Ch.   Objective:  There were no vitals filed for this visit. There is no height or weight on file to calculate BMI. Constitutional Well developed. Well nourished.  Vascular Foot warm and well perfused. Capillary refill normal to all digits.  Calf is soft and supple, no posterior calf or knee pain, negative Homans' sign  Neurologic Normal speech. Oriented to person, place, and time. Epicritic sensation to light touch grossly present bilaterally.  No notable allodynia  Dermatologic Significant improvement in healing there is still some hypertrophy and tenderness around the scar  Orthopedic: Tenderness to palpation noted about the surgical site.  Over all much improved    Assessment:   1. Plantar fibromatosis     Plan:  Patient was evaluated and treated and all questions answered.  S/p foot surgery left So far has improved quite a bit.  Pain is much better managed.  She has her evaluation for CRPS next week.  I think the chance of this is unlikely but I would still like her to be evaluated considering how difficult this has been.  I think likely this is a painful scar secondary to delayed wound healing.  We discussed the option of physical therapy for scar massage in 2 to 3 weeks, currently still tender and she will let me know how she is doing.  I will see her back in 6 weeks for follow-up.   Return in about 6 weeks (around 08/26/2022) for post op (no x-rays).

## 2022-07-20 ENCOUNTER — Encounter: Payer: Medicaid Other | Admitting: Physical Medicine & Rehabilitation

## 2022-07-27 ENCOUNTER — Telehealth: Payer: Self-pay

## 2022-07-27 NOTE — Telephone Encounter (Signed)
Pt called again asking if you could please call in her pain medication (oxycodone 10) to the pharmacy as she is in a lot of pain. [facility]r pharmacy is [facility] [address] [facility] [address]

## 2022-07-28 ENCOUNTER — Telehealth: Payer: Self-pay | Admitting: Podiatry

## 2022-07-28 MED ORDER — OXYCODONE-ACETAMINOPHEN 5-325 MG PO TABS: 1.0000 | ORAL_TABLET | ORAL | 0 refills | Status: AC | PRN

## 2022-07-28 NOTE — Telephone Encounter (Signed)
Pt called again needing to get her pain medication refilled. She has called 2 times yesterday and again today. She states she is having a lot of pain in her foot.Pt does have pharyngitis and was asking if she needed tio come to the office today. I explained that Dr Lilian Kapur is in clinic today with pts and she said he does get breaks between pts. I told her Iw ould send the request again and that I don't think she needed to come in to the office.

## 2022-07-28 NOTE — Addendum Note (Signed)
Addended byLilian Kapur, Kimball Appleby R on: 07/28/2022 09:22 AM   Modules accepted: Orders

## 2022-07-28 NOTE — Telephone Encounter (Signed)
Per Dr Lilian Kapur he called it in the medication and stated he did call in a lower dose of pain medication and if needed she could take 2 if needed. Dr Lilian Kapur stated last visit she seemed to be doing better.   I did notify pt that the medication was sent in and of the lower dose.She said thank you

## 2022-07-28 NOTE — Telephone Encounter (Signed)
Rx has been refilled she may take 2 of them if needed, she was doing much better after her last visit so we decreased her to 5 mg Percocet

## 2022-08-08 ENCOUNTER — Other Ambulatory Visit: Payer: Self-pay | Admitting: Podiatry

## 2022-08-09 ENCOUNTER — Telehealth: Payer: Self-pay | Admitting: Podiatry

## 2022-08-09 MED ORDER — OXYCODONE-ACETAMINOPHEN 5-325 MG PO TABS: 1.0000 | ORAL_TABLET | Freq: Four times a day (QID) | ORAL | 0 refills | Status: AC | PRN

## 2022-08-09 MED ORDER — GABAPENTIN 300 MG PO CAPS: ORAL_CAPSULE | ORAL | 0 refills | Status: DC

## 2022-08-09 NOTE — Telephone Encounter (Signed)
Pt called in to request a Rx refill on Gabapentin & Oxycodone scripts. Please advise.

## 2022-08-26 ENCOUNTER — Ambulatory Visit (INDEPENDENT_AMBULATORY_CARE_PROVIDER_SITE_OTHER): Payer: Medicaid Other | Admitting: Podiatry

## 2022-08-26 DIAGNOSIS — M79672 Pain in left foot: Secondary | ICD-10-CM

## 2022-08-26 DIAGNOSIS — M722 Plantar fascial fibromatosis: Secondary | ICD-10-CM

## 2022-08-26 DIAGNOSIS — G8929 Other chronic pain: Secondary | ICD-10-CM

## 2022-08-26 NOTE — Patient Instructions (Addendum)
Please go to physical therapy as I have recommended. Please schedule your pain management evaluation and appointment as I have recommended.   We also discussed the option of a steroid injection into the scar to help with pain, please let me know if you would like to do this  For pain: take 1,000mg  of tylenol every 8 hours as needed, you can take 3 of the 300mg  gabapentin every 8 hours as needed. Use the lidocaine patches as needed as well as the ointment I previously prescribed. Your insurance does not cover the patches.

## 2022-08-29 NOTE — Progress Notes (Signed)
  Subjective:  Patient ID: Monique Key, female    DOB: Dec 16, 1960,  MRN: [id number]  Chief Complaint  Patient presents with   Plantar Fasciitis    6 weeks (around 08/26/2022) for post op (no x-rays).    61 y.o. female returns for post-op check.  Says he is still having quite a bit of pain.  She did not go to the pain management doctor, she canceled her appointment because she said she had a family issue going on.  Also says she does not want to go to PT.  She would like to have more of her oxycodone refilled.  She says she is taking the gabapentin some.  Review of Systems: Negative except as noted in the HPI. Denies N/V/F/Ch.   Objective:  There were no vitals filed for this visit. There is no height or weight on file to calculate BMI. Constitutional Well developed. Well nourished.  Vascular Foot warm and well perfused. Capillary refill normal to all digits.  Calf is soft and supple, no posterior calf or knee pain, negative Homans' sign  Neurologic Normal speech. Oriented to person, place, and time. Epicritic sensation to light touch grossly present bilaterally.  No notable allodynia  Dermatologic Reduction in hypertrophy, scar is well-healed tender to touch, less so with distraction  Orthopedic: Minimal edema, some pain to palpation.    Assessment:   1. Plantar fibromatosis   2. Chronic pain in left foot      Plan:  Patient was evaluated and treated and all questions answered.  S/p foot surgery left She continues to have pain around her scar.  I discussed further treatment options including an injection into the scar to alleviate hypertrophy and pain and tenderness.  She says she does not want to do this.  I also discussed with her chronic pain management and that I had referred her for an evaluation, she did not go to either of her appointments and rescheduled them.  Understandably she did have a family issue happening but she has not rescheduled yet.  She indicates she  only wants to have more of her oxycodone.  I discussed with her that this far out from surgery I would prefer to pursue other multimodal analgesia and treatment options including a injection and/or physical therapy scar massage and manipulation.  She does not want to do either 1 of these things.  We will offer her second opinion on her pain management if she is interested   Return if symptoms worsen or fail to improve.

## 2022-08-30 NOTE — Progress Notes (Signed)
Patient would like to go with [facility] pain management.

## 2022-08-30 NOTE — Addendum Note (Signed)
Addended byLilian Kapur, Payden Docter R on: 08/30/2022 05:35 PM   Modules accepted: Orders

## 2022-08-31 NOTE — Progress Notes (Signed)
Referral information has been sent to [facility]

## 2022-08-31 NOTE — Progress Notes (Signed)
Nevermind I forgot we were sending her to [facility] medical

## 2022-08-31 NOTE — Progress Notes (Signed)
Where is the patient being referred to for pain management?

## 2022-10-08 ENCOUNTER — Other Ambulatory Visit: Payer: Self-pay

## 2022-10-08 ENCOUNTER — Emergency Department (HOSPITAL_COMMUNITY)
Admission: EM | Admit: 2022-10-08 | Discharge: 2022-10-08 | Disposition: A | Discharge status: Home / Self Care | Payer: Medicaid Other | Attending: Emergency Medicine | Admitting: Emergency Medicine

## 2022-10-08 ENCOUNTER — Encounter (HOSPITAL_COMMUNITY): Payer: Self-pay | Admitting: Oncology

## 2022-10-08 DIAGNOSIS — Z20822 Contact with and (suspected) exposure to covid-19: Secondary | ICD-10-CM | POA: Insufficient documentation

## 2022-10-08 LAB — RESP PANEL BY RT-PCR (RSV, FLU A&B, COVID)  RVPGX2
Influenza A by PCR: NEGATIVE
Influenza B by PCR: NEGATIVE
Resp Syncytial Virus by PCR: NEGATIVE
SARS Coronavirus 2 by RT PCR: NEGATIVE

## 2022-10-08 NOTE — Discharge Instructions (Signed)
It was a pleasure taking care of you today.  As discussed, your COVID and flu test are pending.  Results should be available on MyChart in the next hour or 2.  Follow-up with PCP if symptoms do not improve over the next few days.  Return to the ER for new or worsening symptoms.

## 2022-10-08 NOTE — ED Triage Notes (Signed)
Pt has had contact w/ a Covid positive member of the family. Denies sx currently.

## 2022-10-08 NOTE — ED Provider Notes (Signed)
[facility] Provider Note   CSN: [id number] Arrival date & time: 10/08/22  1844     History  Chief Complaint  Patient presents with   Covid Exposure    Monique Key is a 62 y.o. female with a past medical history significant for polysubstance abuse who presents to the ED after a COVID exposure.  Patient states her daughter tested positive for COVID.  Patient is currently asymptomatic.  Patient requesting testing.  Patient denies chest pain, shortness of breath, abdominal pain, nausea, vomiting.  History obtained from patient and past medical records. No interpreter used during encounter.       Home Medications Prior to Admission medications   Medication Sig Start Date End Date Taking? Authorizing Provider  amLODipine (NORVASC) 10 MG tablet Take by mouth. 01/19/21   [provider]  atorvastatin (LIPITOR) 20 MG tablet Take 20 mg by mouth daily. 12/23/21   [provider]  clobetasol cream (TEMOVATE) 2.70 % Apply 1 Application topically 2 (two) times daily. 06/29/22   McDonald, Stephan Minister, DPM  gabapentin (NEURONTIN) 300 MG capsule TAKE 1 CAPSULE BY MOUTH TWICE DAILY WITH BREAKFAST AND LUNCH AND 2 AT BEDTIME. 08/09/22   McDonald, Stephan Minister, DPM  hydrOXYzine (VISTARIL) 25 MG capsule Take 25 mg by mouth 3 (three) times daily as needed. 12/24/21   [provider]  lidocaine (XYLOCAINE) 5 % ointment Apply 1 Application topically as needed. 06/14/22   McDonald, Stephan Minister, DPM  pantoprazole (PROTONIX) 40 MG tablet Take 40 mg by mouth daily. 12/23/21   [provider]      Allergies    Multihance [gadobenate] and Coconut fatty acids    Review of Systems   Review of Systems  Constitutional:  Negative for chills and fever.  HENT:  Negative for rhinorrhea and sore throat.   Eyes:  Negative for visual disturbance.  Respiratory:  Negative for shortness of breath.   Cardiovascular:  Negative for chest pain and palpitations.   Gastrointestinal:  Negative for abdominal pain.  Genitourinary:  Negative for dysuria.  Musculoskeletal:  Negative for myalgias.  Skin:  Negative for color change and rash.  Neurological:  Negative for dizziness and light-headedness.    Physical Exam Updated Vital Signs BP (!) 164/99 (BP Location: Left Arm)   Pulse 79   Temp 99 F (37.2 C) (Oral)   Resp 18   Ht 5\' 6"  (1.676 m)   Wt 75.3 kg   SpO2 98%   BMI 26.79 kg/m  Physical Exam Vitals and nursing note reviewed.  Constitutional:      General: She is not in acute distress.    Appearance: She is not ill-appearing.  HENT:     Head: Normocephalic.  Eyes:     Pupils: Pupils are equal, round, and reactive to light.  Cardiovascular:     Rate and Rhythm: Normal rate and regular rhythm.     Pulses: Normal pulses.     Heart sounds: Normal heart sounds. No murmur heard.    No friction rub. No gallop.  Pulmonary:     Effort: Pulmonary effort is normal.     Breath sounds: Normal breath sounds.  Abdominal:     General: Abdomen is flat. There is no distension.     Palpations: Abdomen is soft.     Tenderness: There is no abdominal tenderness. There is no guarding or rebound.  Musculoskeletal:        General: Normal range of motion.  Cervical back: Neck supple.  Skin:    General: Skin is warm and dry.  Neurological:     General: No focal deficit present.     Mental Status: She is alert.  Psychiatric:        Mood and Affect: Mood normal.        Behavior: Behavior normal.     ED Results / Procedures / Treatments   Labs (all labs ordered are listed, but only abnormal results are displayed) Labs Reviewed  RESP PANEL BY RT-PCR (RSV, FLU A&B, COVID)  [id number]    EKG None  Radiology No results found.  Procedures Procedures    Medications Ordered in ED Medications - No data to display  ED Course/ Medical Decision Making/ A&P                             Medical Decision Making  62 year old female presents  to the ED after a COVID exposure.  Patient is currently asymptomatic.  Upon arrival, stable vitals.  Patient in no acute distress. Benign physical exam. Respiratory panel obtained.  Results pending.  Advised patient she can follow-up on MyChart for results. Strict ED precautions discussed with patient. Patient states understanding and agrees to plan. Patient discharged home in no acute distress and stable vitals  Has PCP Hx polysubstance abuse        Final Clinical Impression(s) / ED Diagnoses Final diagnoses:  Exposure to COVID-19 virus    Rx / DC Orders ED Discharge Orders     None         Karie Kirks 10/08/22 1958    Elgie Congo, MD 10/10/22 1320

## 2022-12-07 ENCOUNTER — Ambulatory Visit: Payer: Medicaid Other | Admitting: Podiatry

## 2022-12-09 ENCOUNTER — Ambulatory Visit: Payer: Medicaid Other | Admitting: Podiatry

## 2022-12-21 ENCOUNTER — Ambulatory Visit (INDEPENDENT_AMBULATORY_CARE_PROVIDER_SITE_OTHER): Payer: Medicaid Other | Admitting: Podiatry

## 2022-12-21 DIAGNOSIS — G8929 Other chronic pain: Secondary | ICD-10-CM | POA: Diagnosis not present

## 2022-12-21 DIAGNOSIS — G5792 Unspecified mononeuropathy of left lower limb: Secondary | ICD-10-CM | POA: Diagnosis not present

## 2022-12-21 DIAGNOSIS — M79672 Pain in left foot: Secondary | ICD-10-CM | POA: Diagnosis not present

## 2022-12-26 NOTE — Progress Notes (Signed)
  Subjective:  Patient ID: Monique Key, female    DOB: [birth year],  MRN: [id number]  Chief Complaint  Patient presents with   Numbness    est - left foot great toe - numbeness & tingling    62 y.o. female returns for follow up she has noted improvement since the last visit, she is undergoing pain management now and takes oxycodone and gabapentin for this long term. She notes it feels better with a compression sleeve.  Review of Systems: Negative except as noted in the HPI. Denies N/V/F/Ch.   Objective:  There were no vitals filed for this visit. There is no height or weight on file to calculate BMI. Constitutional Well developed. Well nourished.  Vascular Foot warm and well perfused. Capillary refill normal to all digits.  Calf is soft and supple, no posterior calf or knee pain, negative Homans' sign  Neurologic Normal speech. Oriented to person, place, and time. Epicritic sensation to light touch grossly present bilaterally.  No notable allodynia  Dermatologic Scar shows minimal hypertrophy, tender  Orthopedic: No edema. Pain with palpation first plantar interspace    Assessment:   1. Chronic pain in left foot   2. Peripheral neuritis of left foot      Plan:  Patient was evaluated and treated and all questions answered.  S/p foot surgery left We again discussed the option of a corticosteroid injection into the interspace and/or the scar. I do think this could offer some relief. Pain management and time has led to improvement, she is better than our last exam. I discussed we could give this more time, if still painful at 1 year after surgery would recommend proceeding with injection if she is interested, she will return to see me PRN    Return if symptoms worsen or fail to improve.

## 2023-04-27 ENCOUNTER — Other Ambulatory Visit: Payer: Self-pay | Admitting: Family Medicine

## 2023-04-27 DIAGNOSIS — R1084 Generalized abdominal pain: Secondary | ICD-10-CM

## 2023-04-28 ENCOUNTER — Ambulatory Visit
Admission: RE | Admit: 2023-04-28 | Discharge: 2023-04-28 | Disposition: A | Discharge status: Home / Self Care | Payer: Medicaid Other | Source: Ambulatory Visit | Attending: Family Medicine | Admitting: Family Medicine

## 2023-04-28 DIAGNOSIS — R1084 Generalized abdominal pain: Secondary | ICD-10-CM

## 2023-05-03 ENCOUNTER — Other Ambulatory Visit (HOSPITAL_COMMUNITY)
Admission: RE | Admit: 2023-05-03 | Discharge: 2023-05-03 | Disposition: A | Discharge status: Home / Self Care | Payer: Medicaid Other | Source: Ambulatory Visit | Attending: Family Medicine | Admitting: Family Medicine

## 2023-05-03 DIAGNOSIS — Z113 Encounter for screening for infections with a predominantly sexual mode of transmission: Secondary | ICD-10-CM | POA: Diagnosis present

## 2023-05-03 DIAGNOSIS — Z124 Encounter for screening for malignant neoplasm of cervix: Secondary | ICD-10-CM | POA: Diagnosis present

## 2023-05-05 LAB — MOLECULAR ANCILLARY ONLY
Bacterial Vaginitis (gardnerella): POSITIVE — AB
Candida Glabrata: NEGATIVE
Candida Vaginitis: NEGATIVE
Chlamydia: NEGATIVE
Comment: NEGATIVE
Comment: NEGATIVE
Comment: NEGATIVE
Comment: NEGATIVE
Comment: NEGATIVE
Comment: NORMAL
Neisseria Gonorrhea: NEGATIVE
Trichomonas: POSITIVE — AB

## 2023-05-06 LAB — CYTOLOGY - PAP
Comment: NEGATIVE
Comment: NEGATIVE
Diagnosis: NEGATIVE
HSV1: NEGATIVE
HSV2: NEGATIVE
High risk HPV: NEGATIVE

## 2023-05-30 ENCOUNTER — Ambulatory Visit (INDEPENDENT_AMBULATORY_CARE_PROVIDER_SITE_OTHER): Payer: Medicaid Other | Admitting: Podiatry

## 2023-05-30 DIAGNOSIS — G5782 Other specified mononeuropathies of left lower limb: Secondary | ICD-10-CM

## 2023-05-30 DIAGNOSIS — G5762 Lesion of plantar nerve, left lower limb: Secondary | ICD-10-CM

## 2023-05-30 DIAGNOSIS — G8929 Other chronic pain: Secondary | ICD-10-CM

## 2023-05-30 DIAGNOSIS — G5792 Unspecified mononeuropathy of left lower limb: Secondary | ICD-10-CM

## 2023-05-30 NOTE — Progress Notes (Signed)
  Subjective:  Patient ID: Monique Key, female    DOB: 1961-01-12,  MRN: [id number]  Chief Complaint  Patient presents with   Foot Pain    left foot pain( surgical foot) discuss possible PT    62 y.o. female returns for follow up she notes that it feels worse recently, she was dismissed from her pain management practice for missed visits, she has an upcoming new patient appointment in [facility] but not until October.  Review of Systems: Negative except as noted in the HPI. Denies N/V/F/Ch.   Objective:  There were no vitals filed for this visit. There is no height or weight on file to calculate BMI. Constitutional Well developed. Well nourished.  Vascular Foot warm and well perfused. Capillary refill normal to all digits.  Calf is soft and supple, no posterior calf or knee pain, negative Homans' sign  Neurologic Normal speech. Oriented to person, place, and time. Epicritic sensation to light touch grossly present bilaterally.  No notable allodynia  Dermatologic Scar shows minimal hypertrophy, tender  Orthopedic: No edema. Pain with palpation first plantar interspace    Assessment:   1. Nerve entrapment of lower limb, left   2. Chronic pain in left foot   3. Peripheral neuritis of left foot      Plan:  Patient was evaluated and treated and all questions answered.  S/p foot surgery left We again discussed the option of a corticosteroid injection into the interspace and/or the scar. I do think this could offer some relief.  She does not want to proceed with this at this time.  She did inquire about physical therapy if this would help, I discussed with her that this would be reasonable to see if scar manipulation and/or desensitization would offer her relief.  Referral has been sent.  Follow-up with me as needed if she is interested in proceeding with injection therapy.   Return if symptoms worsen or fail to improve.

## 2023-05-30 NOTE — Patient Instructions (Signed)
Call to schedule physical therapy: [facility] at [place] [address]  [phone]  

## 2023-06-06 NOTE — Addendum Note (Signed)
Addended byLilian Kapur, Valgene Deloatch R on: 06/06/2023 09:01 AM   Modules accepted: Level of Service

## 2023-06-14 ENCOUNTER — Ambulatory Visit: Payer: Medicaid Other | Attending: Podiatry | Admitting: Physical Therapy

## 2023-06-14 DIAGNOSIS — M79672 Pain in left foot: Secondary | ICD-10-CM | POA: Diagnosis present

## 2023-06-14 DIAGNOSIS — R296 Repeated falls: Secondary | ICD-10-CM

## 2023-06-14 DIAGNOSIS — R2681 Unsteadiness on feet: Secondary | ICD-10-CM

## 2023-06-14 DIAGNOSIS — G5792 Unspecified mononeuropathy of left lower limb: Secondary | ICD-10-CM | POA: Insufficient documentation

## 2023-06-14 DIAGNOSIS — M6281 Muscle weakness (generalized): Secondary | ICD-10-CM | POA: Diagnosis present

## 2023-06-14 DIAGNOSIS — G5782 Other specified mononeuropathies of left lower limb: Secondary | ICD-10-CM | POA: Diagnosis not present

## 2023-06-14 DIAGNOSIS — R208 Other disturbances of skin sensation: Secondary | ICD-10-CM

## 2023-06-14 DIAGNOSIS — G8929 Other chronic pain: Secondary | ICD-10-CM | POA: Diagnosis not present

## 2023-06-14 DIAGNOSIS — R2689 Other abnormalities of gait and mobility: Secondary | ICD-10-CM

## 2023-06-14 NOTE — Therapy (Addendum)
OUTPATIENT PHYSICAL THERAPY LOWER EXTREMITY EVALUATION   Patient Name: Monique Key MRN: [id number] DOB:[birth year], 62 y.o., female Today's Date: 06/14/2023  END OF SESSION:  PT End of Session - 06/14/23 0943     Visit Number 1    Number of Visits 7    Date for PT Re-Evaluation 08/09/23    Authorization Type [employer]    PT Start Time 0932    PT Stop Time 1013    PT Time Calculation (min) 41 min    Activity Tolerance Patient limited by pain    Behavior During Therapy [facility] for tasks assessed/performed;Anxious;Restless             No past medical history on file. No past surgical history on file. Patient Active Problem List   Diagnosis Date Noted   Acute cystitis without hematuria 01/19/2021   Cocaine use disorder, severe, dependence (HCC) 01/19/2021   Tetrahydrocannabinol (THC) use disorder, mild, abuse 01/19/2021   Alcohol use disorder, severe, dependence (HCC) 01/17/2021    [facility]   REFERRING PROVIDER: Edwin Cap, DPM  REFERRING DIAG: [phone] (ICD-10-CM) - Chronic pain in left foot G57.92 (ICD-10-CM) - Peripheral neuritis of left foot G57.82 (ICD-10-CM) - Nerve entrapment of lower limb, left  THERAPY DIAG:  Pain in left foot - Plan: PT plan of care cert/re-cert  Muscle weakness (generalized) - Plan: PT plan of care cert/re-cert  Other disturbances of skin sensation - Plan: PT plan of care cert/re-cert  Repeated falls - Plan: PT plan of care cert/re-cert  Other abnormalities of gait and mobility - Plan: PT plan of care cert/re-cert  Unsteadiness on feet - Plan: PT plan of care cert/re-cert  Rationale for Evaluation and Treatment: Rehabilitation  ONSET DATE: 05/30/2023 (referral)   SUBJECTIVE:   SUBJECTIVE STATEMENT: Pt ambulated into clinic without AD, relying on furniture walking for stability. Pt reports she has had foot pain for about 4 years. Had surgery on her foot last year and has had >12 falls ever since due to  numbness of her L foot and imbalance. Has hit her head several times and needs help to get up. Most falls have been in the bathroom and the kitchen. Pt reports she is unable to tolerate washing her L foot due to pain and is reliant on pain meds as she has poor pain tolerance.   PERTINENT HISTORY:polysubstance abuse, fibroma removal L foot on 04/23/22  PAIN:  Are you having pain? Yes: NPRS scale: 8.5/10 Pain location: L foot  Pain description: shooting, burning, stabbing Aggravating factors: Walking, rain  Relieving factors: Pain meds  PRECAUTIONS: Fall  RED FLAGS: None   WEIGHT BEARING RESTRICTIONS: No  FALLS:  Has patient fallen in last 6 months? Yes. Number of falls >12   LIVING ENVIRONMENT: Lives with: lives in a boarding home Lives in: House/apartment Stairs: Yes: External: 2 sets of 6 steps; none Has following equipment at home: Single point cane  OCCUPATION: On disability   PLOF: Independent  PATIENT GOALS: "to get better on this foot and stop falling"   NEXT MD VISIT: N/A  OBJECTIVE:    COGNITION: Overall cognitive status: Impaired     SENSATION: Pt reports numbness/tingling in her first and second metatarsals of L foot and a shooting sensation eminating from her surgical incision along medial arch of foot   EDEMA: pt reports frequent swelling of hallux of L foot    POSTURE: rounded shoulders and forward head  PALPATION: Extremely TTP along L first metatarsal and surgical incision.  Pt unable to tolerate more than light touch to these areas and required mod A to stabilize foot and pt pulling foot away. Pt unable to tolerate flexion of DIP of first toe and yelled w/movement.    LOWER EXTREMITY MMT: Tested in long sitting position   MMT Right eval Left eval  Hip flexion    Hip extension    Hip abduction    Hip adduction    Hip internal rotation    Hip external rotation    Knee flexion    Knee extension    Ankle dorsiflexion  2- (pain)  Ankle  plantarflexion    Ankle inversion    Ankle eversion     (Blank rows = not tested)  GAIT: Distance walked: Various clinic distances  Assistive device utilized: None Level of assistance: SBA Comments: Pt w/antalgic gait pattern on L foot and relying on furniture walking to stabilize through clinic. Pt reports she typically uses a cane but left it at home    TODAY'S TREATMENT:       Next session                                                                                                                             PATIENT EDUCATION:  Education details: POC, eval findings, desensitization techniques, initial HEP  Person educated: Patient Education method: Explanation, Demonstration, and Handouts Education comprehension: verbalized understanding and needs further education  HOME EXERCISE PROGRAM: Access Code: [email or web address] URL: [email or web address] Date: 06/14/2023 Prepared by: Alethia Berthold Caige Almeda  Program Notes Foot Desensitization -Begin rubbing your left foot with a cotton ball or soft blanket-Work up to rubbing your foot with your hands for 3-5 minutes at a time   Exercises - Seated Toe Towel Scrunches  - 1 x daily - 7 x weekly - 3 sets - 10 reps  ASSESSMENT:  CLINICAL IMPRESSION: Patient is a 62 year old female referred to Neuro OPPT for L foot pain.  Pt's PMH is significant for: bipolar disorder, LLE fibroma removal on 04/23/22 and polysubstance abuse. The following deficits were present during the exam: impaired sensation of L foot, allodynia of L foot, decreased L ankle DF strength and impaired balance. Based on falls history, pt is an incr risk for falls. Pt would benefit from skilled PT to address these impairments and functional limitations to maximize functional mobility independence.    OBJECTIVE IMPAIRMENTS: Abnormal gait, decreased activity tolerance, decreased balance, decreased cognition, decreased coordination, decreased endurance, decreased knowledge of  use of DME, decreased mobility, difficulty walking, decreased strength, decreased safety awareness, impaired sensation, and pain  ACTIVITY LIMITATIONS: carrying, lifting, standing, squatting, stairs, bed mobility, hygiene/grooming, and locomotion level  PARTICIPATION LIMITATIONS: cleaning, interpersonal relationship, driving, shopping, community activity, and yard work  PERSONAL FACTORS: Behavior pattern, Past/current experiences, Social background, Time since onset of injury/illness/exacerbation, and 1 comorbidity: L peripheral neuropathy  are also affecting patient's functional outcome.   REHAB POTENTIAL: Fair due to lack of social support, reports  of unsafe living environment and allodynia of L foot   CLINICAL DECISION MAKING: Stable/uncomplicated  EVALUATION COMPLEXITY: Low   GOALS: Goals reviewed with patient? Yes  SHORT TERM GOALS: Target date: 07/12/2023  Pt will be independent with initial HEP for improved strength, balance, desensitization and gait.  Baseline: established on eval  Goal status: INITIAL  2.  Gait speed to be assessed w/SPC and LTG updated Baseline:  Goal status: INITIAL  3.  Pt will be compliant w/scar mobilization techniques for improved weightbearing tolerance on L foot and reduced fall risk  Baseline:  Goal status: INITIAL  4.  LEFS to be assessed and LTG updated  Baseline:  Goal status: INITIAL   LONG TERM GOALS: Target date: 07/26/2023   Pt will be independent with final HEP for improved strength, balance, desensitization and gait.  Baseline:  Goal status: INITIAL  2.  Gait speed goal  Baseline:  Goal status: INITIAL  3.  LEFS goal  Baseline:  Goal status: INITIAL    PLAN:  PT FREQUENCY: 1x/week  PT DURATION: 6 weeks (POC written for 8 weeks due to delay in scheduling)   PLANNED INTERVENTIONS: Therapeutic exercises, Therapeutic activity, Neuromuscular re-education, Balance training, Gait training, Patient/Family education, Self  Care, Joint mobilization, Stair training, Orthotic/Fit training, Aquatic Therapy, Dry Needling, Electrical stimulation, scar mobilization, Taping, Manual therapy, and Re-evaluation  PLAN FOR NEXT SESSION: LEFS and gait speed w/SPC and update goal. Check BP. Desensitization techniques, tens unit, single leg stability. Add DF to HEP   Check all possible CPT codes: 16109 - PT Re-evaluation, 97110- Therapeutic Exercise, [phone]- Neuro Re-education, [phone] - Gait Training, [phone] - Manual Therapy, [phone] - Therapeutic Activities, [phone] - Self Care, [phone] - Electrical stimulation (Manual), and [id number] - Aquatic therapy    Check all conditions that are expected to impact treatment: Cognitive Impairment or Intellectual disability and Social determinants of health   If treatment provided at initial evaluation, no treatment charged due to lack of authorization.     Toree Edling E Tammy Wickliffe, PT 06/14/2023, 10:35 AM

## 2023-06-24 ENCOUNTER — Ambulatory Visit: Payer: Medicaid Other | Attending: Podiatry

## 2023-06-24 DIAGNOSIS — R208 Other disturbances of skin sensation: Secondary | ICD-10-CM | POA: Insufficient documentation

## 2023-06-24 DIAGNOSIS — R296 Repeated falls: Secondary | ICD-10-CM | POA: Insufficient documentation

## 2023-06-24 DIAGNOSIS — R2689 Other abnormalities of gait and mobility: Secondary | ICD-10-CM | POA: Insufficient documentation

## 2023-06-24 DIAGNOSIS — M79672 Pain in left foot: Secondary | ICD-10-CM | POA: Insufficient documentation

## 2023-06-24 DIAGNOSIS — M6281 Muscle weakness (generalized): Secondary | ICD-10-CM | POA: Insufficient documentation

## 2023-06-24 DIAGNOSIS — R2681 Unsteadiness on feet: Secondary | ICD-10-CM | POA: Insufficient documentation

## 2023-06-28 ENCOUNTER — Ambulatory Visit: Payer: Medicaid Other | Admitting: Physical Therapy

## 2023-06-28 VITALS — BP 151/88 | HR 79

## 2023-06-28 DIAGNOSIS — R2689 Other abnormalities of gait and mobility: Secondary | ICD-10-CM

## 2023-06-28 DIAGNOSIS — M6281 Muscle weakness (generalized): Secondary | ICD-10-CM

## 2023-06-28 DIAGNOSIS — R2681 Unsteadiness on feet: Secondary | ICD-10-CM

## 2023-06-28 DIAGNOSIS — M79672 Pain in left foot: Secondary | ICD-10-CM

## 2023-06-28 DIAGNOSIS — R208 Other disturbances of skin sensation: Secondary | ICD-10-CM | POA: Diagnosis present

## 2023-06-28 DIAGNOSIS — R296 Repeated falls: Secondary | ICD-10-CM | POA: Diagnosis present

## 2023-06-28 NOTE — Therapy (Signed)
OUTPATIENT PHYSICAL THERAPY LOWER EXTREMITY TREATMENT   Patient Name: Monique Key MRN: [id number] DOB:[birth year], 62 y.o., female Today's Date: 06/28/2023  END OF SESSION:  PT End of Session - 06/28/23 0936     Visit Number 2    Number of Visits 7    Date for PT Re-Evaluation 08/09/23    Authorization Type [employer] Medicaid    PT Start Time [phone]    PT Stop Time [phone]   Session limited as pt has not worked on desensitization   PT Time Calculation (min) 18 min    Activity Tolerance Patient limited by pain    Behavior During Therapy [facility] for tasks assessed/performed              No past medical history on file. No past surgical history on file. Patient Active Problem List   Diagnosis Date Noted   Acute cystitis without hematuria 01/19/2021   Cocaine use disorder, severe, dependence (HCC) 01/19/2021   Tetrahydrocannabinol (THC) use disorder, mild, abuse 01/19/2021   Alcohol use disorder, severe, dependence (HCC) 01/17/2021    [facility]   REFERRING PROVIDER: Edwin Cap, DPM  REFERRING DIAG: [phone] (ICD-10-CM) - Chronic pain in left foot G57.92 (ICD-10-CM) - Peripheral neuritis of left foot G57.82 (ICD-10-CM) - Nerve entrapment of lower limb, left  THERAPY DIAG:  Pain in left foot  Muscle weakness (generalized)  Unsteadiness on feet  Other abnormalities of gait and mobility  Rationale for Evaluation and Treatment: Rehabilitation  ONSET DATE: 05/30/2023 (referral)   SUBJECTIVE:   SUBJECTIVE STATEMENT: Pt ambulated into clinic w/SPC. States she fell on Sunday while getting out of the shower. Called her roommate to help her up. Has been trying her exercises but still cannot tolerate touching her foot. Rating pain in her L foot as an 8/10 this date.   PERTINENT HISTORY:polysubstance abuse, fibroma removal L foot on 04/23/22  PAIN:  Are you having pain? Yes: NPRS scale: 8/10 Pain location: L foot  Pain description: shooting, burning,  stabbing Aggravating factors: Walking, rain  Relieving factors: Pain meds  PRECAUTIONS: Fall  RED FLAGS: None   WEIGHT BEARING RESTRICTIONS: No  FALLS:  Has patient fallen in last 6 months? Yes. Number of falls >12   LIVING ENVIRONMENT: Lives with: lives in a boarding home Lives in: House/apartment Stairs: Yes: External: 2 sets of 6 steps; none Has following equipment at home: Single point cane  OCCUPATION: On disability   PLOF: Independent  PATIENT GOALS: "to get better on this foot and stop falling"   NEXT MD VISIT: N/A  OBJECTIVE:    COGNITION: Overall cognitive status: Impaired     SENSATION: Pt reports numbness/tingling in her first and second metatarsals of L foot and a shooting sensation eminating from her surgical incision along medial arch of foot   EDEMA: pt reports frequent swelling of hallux of L foot    POSTURE: rounded shoulders and forward head  PALPATION: Extremely TTP along L first metatarsal and surgical incision. Pt unable to tolerate more than light touch to these areas and required mod A to stabilize foot and pt pulling foot away. Pt unable to tolerate flexion of DIP of first toe and yelled w/movement.    LOWER EXTREMITY MMT: Tested in long sitting position   MMT Right eval Left eval  Hip flexion    Hip extension    Hip abduction    Hip adduction    Hip internal rotation    Hip external rotation    Knee  flexion    Knee extension    Ankle dorsiflexion  2- (pain)  Ankle plantarflexion    Ankle inversion    Ankle eversion     (Blank rows = not tested)  GAIT: Distance walked: Various clinic distances  Assistive device utilized: None Level of assistance: SBA Comments: Pt w/antalgic gait pattern on L foot and relying on furniture walking to stabilize through clinic. Pt reports she typically uses a cane but left it at home   VITALS  Vitals:   06/28/23 0938  BP: (!) 151/88  Pulse: 79     TODAY'S TREATMENT:       Ther Act   Assessed vitals (see above) and systolic BP elevated but within limits for therapy.  LEFS: 28/80  Provided pt w/handout providing picture demonstration of scar mobilization and educated pt on performing w/something soft, such as a cotton ball, first and progressing to her fingers. Informed pt that until pt can tolerate therapist touching her foot, we are limited in therapy regarding scar mobilization. Informed pt that she must work on her desensitization techniques daily for 2 weeks and then will resume therapy. Pt verbalized understanding. Provided pt w/updated appointment calendar.                                                                                                                             PATIENT EDUCATION:  Education details: See above  Person educated: Patient Education method: Chief Technology Officer Education comprehension: verbalized understanding and needs further education  HOME EXERCISE PROGRAM: Access Code: [email or web address] URL: [email or web address] Date: 06/14/2023 Prepared by: Alethia Berthold Chavy [employer]  Program Notes Foot Desensitization -Begin rubbing your left foot with a cotton ball or soft blanket-Work up to rubbing your foot with your hands for 3-5 minutes at a time   Exercises - Seated Toe Towel Scrunches  - 1 x daily - 7 x weekly - 3 sets - 10 reps  ASSESSMENT:  CLINICAL IMPRESSION: Session limited as pt has not been working on desensitization or scar mobilization techniques and cannot tolerate touching her foot. Pt reports she has not been performing her toe scrunches with a towel as this is too abrasive, has been using a T-shirt. Informed pt that until she can tolerate light touch to her foot, progress in therapy is very limited. Pt to work on her HEP daily for next 2 weeks in order to benefit from PT session. Pt scored a 28/80 on LEFS, indicative of moderate functional limitation due to L foot pain. Continue POC.    OBJECTIVE IMPAIRMENTS: Abnormal  gait, decreased activity tolerance, decreased balance, decreased cognition, decreased coordination, decreased endurance, decreased knowledge of use of DME, decreased mobility, difficulty walking, decreased strength, decreased safety awareness, impaired sensation, and pain  ACTIVITY LIMITATIONS: carrying, lifting, standing, squatting, stairs, bed mobility, hygiene/grooming, and locomotion level  PARTICIPATION LIMITATIONS: cleaning, interpersonal relationship, driving, shopping, community activity, and yard work  PERSONAL FACTORS: Behavior pattern, Past/current experiences, Social background, Time since onset of  injury/illness/exacerbation, and 1 comorbidity: L peripheral neuropathy  are also affecting patient's functional outcome.   REHAB POTENTIAL: Fair due to lack of social support, reports of unsafe living environment and allodynia of L foot   CLINICAL DECISION MAKING: Stable/uncomplicated  EVALUATION COMPLEXITY: Low   GOALS: Goals reviewed with patient? Yes  SHORT TERM GOALS: Target date: 07/12/2023  Pt will be independent with initial HEP for improved strength, balance, desensitization and gait.  Baseline: established on eval  Goal status: INITIAL  2.  Gait speed to be assessed w/SPC and LTG updated Baseline:  Goal status: INITIAL  3.  Pt will be compliant w/scar mobilization techniques for improved weightbearing tolerance on L foot and reduced fall risk  Baseline:  Goal status: INITIAL  4.  LEFS to be assessed and LTG updated  Baseline: 28/80 Goal status: MET   LONG TERM GOALS: Target date: 07/26/2023   Pt will be independent with final HEP for improved strength, balance, desensitization and gait.  Baseline:  Goal status: INITIAL  2.  Gait speed goal  Baseline:  Goal status: INITIAL  3.  Pt will improve LEFS to >/= 41/80 for reduced pain levels and improved functional mobility  Baseline: 28/80 Goal status: INITIAL    PLAN:  PT FREQUENCY: 1x/week  PT  DURATION: 6 weeks (POC written for 8 weeks due to delay in scheduling)   PLANNED INTERVENTIONS: Therapeutic exercises, Therapeutic activity, Neuromuscular re-education, Balance training, Gait training, Patient/Family education, Self Care, Joint mobilization, Stair training, Orthotic/Fit training, Aquatic Therapy, Dry Needling, Electrical stimulation, scar mobilization, Taping, Manual therapy, and Re-evaluation  PLAN FOR NEXT SESSION:  gait speed w/SPC and update goal. Check BP. Desensitization techniques, tens unit, single leg stability. Add DF to HEP. Has pt been working on scar mobilization?   Check all possible CPT codes: 63016 - PT Re-evaluation, 97110- Therapeutic Exercise, [phone]- Neuro Re-education, [phone] - Gait Training, [phone] - Manual Therapy, [phone] - Therapeutic Activities, [phone] - Self Care, [phone] - Electrical stimulation (Manual), and [id number] - Aquatic therapy    Check all conditions that are expected to impact treatment: Cognitive Impairment or Intellectual disability and Social determinants of health   If treatment provided at initial evaluation, no treatment charged due to lack of authorization.     Jayona Mccaig E Sahmya Arai, PT 06/28/2023, 10:00 AM

## 2023-07-05 ENCOUNTER — Ambulatory Visit: Payer: Medicaid Other | Admitting: Physical Therapy

## 2023-07-12 ENCOUNTER — Encounter: Payer: Self-pay | Admitting: Physical Therapy

## 2023-07-12 ENCOUNTER — Ambulatory Visit: Payer: Medicaid Other | Admitting: Physical Therapy

## 2023-07-12 VITALS — BP 160/83 | HR 81

## 2023-07-12 DIAGNOSIS — M79672 Pain in left foot: Secondary | ICD-10-CM | POA: Diagnosis not present

## 2023-07-12 DIAGNOSIS — R296 Repeated falls: Secondary | ICD-10-CM

## 2023-07-12 DIAGNOSIS — R2689 Other abnormalities of gait and mobility: Secondary | ICD-10-CM

## 2023-07-12 DIAGNOSIS — R2681 Unsteadiness on feet: Secondary | ICD-10-CM

## 2023-07-12 DIAGNOSIS — R208 Other disturbances of skin sensation: Secondary | ICD-10-CM

## 2023-07-12 DIAGNOSIS — M6281 Muscle weakness (generalized): Secondary | ICD-10-CM

## 2023-07-12 NOTE — Therapy (Signed)
OUTPATIENT PHYSICAL THERAPY LOWER EXTREMITY TREATMENT   Patient Name: Monique Key MRN: [id number] DOB:04/24/1961, 62 y.o., female Today's Date: 07/12/2023  END OF SESSION:  PT End of Session - 07/12/23 0937     Visit Number 3    Number of Visits 7    Date for PT Re-Evaluation 08/09/23    Authorization Type [employer]    PT Start Time 0932    PT Stop Time 1011    PT Time Calculation (min) 39 min    Activity Tolerance Patient limited by pain    Behavior During Therapy [facility] for tasks assessed/performed              History reviewed. No pertinent past medical history. History reviewed. No pertinent surgical history. Patient Active Problem List   Diagnosis Date Noted   Acute cystitis without hematuria 01/19/2021   Cocaine use disorder, severe, dependence (HCC) 01/19/2021   Tetrahydrocannabinol (THC) use disorder, mild, abuse 01/19/2021   Alcohol use disorder, severe, dependence (HCC) 01/17/2021    [facility]   REFERRING PROVIDER: Edwin Cap, DPM  REFERRING DIAG: [phone] (ICD-10-CM) - Chronic pain in left foot G57.92 (ICD-10-CM) - Peripheral neuritis of left foot G57.82 (ICD-10-CM) - Nerve entrapment of lower limb, left  THERAPY DIAG:  Pain in left foot  Muscle weakness (generalized)  Unsteadiness on feet  Other abnormalities of gait and mobility  Other disturbances of skin sensation  Repeated falls  Rationale for Evaluation and Treatment: Rehabilitation  ONSET DATE: 05/30/2023 (referral)   SUBJECTIVE:   SUBJECTIVE STATEMENT: Pt ambulated into clinic w/SPC. States she fell on Saturday while getting off of the toilet. She reports she hit her left elbow.  She has a quarter sized dry scuff on left elbow - appears healing, dry and closed.  Her roommate helped her up.  She has not worked on scar mobilization or desensitization of foot.   PERTINENT HISTORY:polysubstance abuse, fibroma removal L foot on 04/23/22  PAIN:  Are you having  pain? Yes: NPRS scale: 8.5-9/10 Pain location: L foot  Pain description: shooting, burning, stabbing Aggravating factors: Walking, rain  Relieving factors: Pain meds; warm water  PRECAUTIONS: Fall  RED FLAGS: None   WEIGHT BEARING RESTRICTIONS: No  FALLS:  Has patient fallen in last 6 months? Yes. Number of falls >12   LIVING ENVIRONMENT: Lives with: lives in a boarding home Lives in: House/apartment Stairs: Yes: External: 2 sets of 6 steps; none Has following equipment at home: Single point cane  OCCUPATION: On disability   PLOF: Independent  PATIENT GOALS: "to get better on this foot and stop falling"   NEXT MD VISIT: N/A  OBJECTIVE:    COGNITION: Overall cognitive status: Impaired     SENSATION: Pt reports numbness/tingling in her first and second metatarsals of L foot and a shooting sensation eminating from her surgical incision along medial arch of foot   EDEMA: pt reports frequent swelling of hallux of L foot    POSTURE: rounded shoulders and forward head  PALPATION: Extremely TTP along L first metatarsal and surgical incision. Pt unable to tolerate more than light touch to these areas and required mod A to stabilize foot and pt pulling foot away. Pt unable to tolerate flexion of DIP of first toe and yelled w/movement.    LOWER EXTREMITY MMT: Tested in long sitting position   MMT Right eval Left eval  Hip flexion    Hip extension    Hip abduction    Hip adduction  Hip internal rotation    Hip external rotation    Knee flexion    Knee extension    Ankle dorsiflexion  2- (pain)  Ankle plantarflexion    Ankle inversion    Ankle eversion     (Blank rows = not tested)  GAIT: Distance walked: Various clinic distances  Assistive device utilized: None Level of assistance: SBA Comments: Pt w/antalgic gait pattern on L foot and relying on furniture walking to stabilize through clinic. Pt reports she typically uses a cane but left it at home    VITALS  Vitals:   07/12/23 0936  BP: (!) 160/83  Pulse: 81     TODAY'S TREATMENT:       Ther Act  Assessed vitals (see above) and systolic BP elevated but within limits for therapy.  PT physically performed desensitization techniques:  light vs deep pressure, rubbing, tissue rubbing, wet vs dry washcloth > scar mobilization:  pinching, pulling horizontally vs vertically, tapping, light scratching; edu on consistency and frequency of performance at home as well as variances in technique to increase sensory input and habituation Discussed pt wanting rubber tip quad cane vs current SPC, pt is unfamiliar to this PT but did discuss concerns of pt wanting to obtain this option through insurance due to insurance limitations should functional status change in near future and pt require increased restrictive AD.  She verbalizes wanting RW and PT stating she is uncertain this would prevent pt falls as nature of falls recently do not sound like they happen in an ambulatory context.  Encouraged pt to focus on HEP and gait mechanics as practiced with SPC vs obtaining new AD as PT is uncertain that current deficits are solely related to recent foot procedure.  Will defer to more familiar PT.  Pt in agreement to hold off. STS w/ RLE elevated on 4" step SBA 2x10 w/ LUE support on chair back Standing march on airex 3x20 progressing to no UE support SBA Standing 4" toe taps from airex no UE support SBA 2x15 w/ RLE only                                                                                                                     PATIENT EDUCATION:  Education details: See above.  Continue HEP. Person educated: Patient Education method: Explanation and Handouts Education comprehension: verbalized understanding and needs further education  HOME EXERCISE PROGRAM: Access Code: [email or web address] URL: [email or web address] Date: 06/14/2023 Prepared by: Alethia Berthold Plaster  Program Notes Foot  Desensitization -Begin rubbing your left foot with a cotton ball or soft blanket-Work up to rubbing your foot with your hands for 3-5 minutes at a time   Exercises - Seated Toe Towel Scrunches  - 1 x daily - 7 x weekly - 3 sets - 10 reps  ASSESSMENT:  CLINICAL IMPRESSION: Patient has higher pain levels today than last visit and has had another fall.  Pt wanting to change her cane but has concerns for OOP cost, but this PT  unsure of benefit of changing cane due to unfamiliarity of patient's normal gait and nature of recent falls being non-ambulatory.  She continues to benefit from skilled PT to address imbalance and gait mechanics in setting on ongoing left foot pain post-procedure.  Will continue to encourage compliance to scar mobilization and desensitization HEP.   OBJECTIVE IMPAIRMENTS: Abnormal gait, decreased activity tolerance, decreased balance, decreased cognition, decreased coordination, decreased endurance, decreased knowledge of use of DME, decreased mobility, difficulty walking, decreased strength, decreased safety awareness, impaired sensation, and pain  ACTIVITY LIMITATIONS: carrying, lifting, standing, squatting, stairs, bed mobility, hygiene/grooming, and locomotion level  PARTICIPATION LIMITATIONS: cleaning, interpersonal relationship, driving, shopping, community activity, and yard work  PERSONAL FACTORS: Behavior pattern, Past/current experiences, Social background, Time since onset of injury/illness/exacerbation, and 1 comorbidity: L peripheral neuropathy  are also affecting patient's functional outcome.   REHAB POTENTIAL: Fair due to lack of social support, reports of unsafe living environment and allodynia of L foot   CLINICAL DECISION MAKING: Stable/uncomplicated  EVALUATION COMPLEXITY: Low   GOALS: Goals reviewed with patient? Yes  SHORT TERM GOALS: Target date: 07/12/2023  Pt will be independent with initial HEP for improved strength, balance, desensitization  and gait.  Baseline: established on eval  Goal status: INITIAL  2.  Gait speed to be assessed w/SPC and LTG updated Baseline:  Goal status: INITIAL  3.  Pt will be compliant w/scar mobilization techniques for improved weightbearing tolerance on L foot and reduced fall risk  Baseline:  Goal status: INITIAL  4.  LEFS to be assessed and LTG updated  Baseline: 28/80 Goal status: MET   LONG TERM GOALS: Target date: 07/26/2023   Pt will be independent with final HEP for improved strength, balance, desensitization and gait.  Baseline:  Goal status: INITIAL  2.  Gait speed goal  Baseline:  Goal status: INITIAL  3.  Pt will improve LEFS to >/= 41/80 for reduced pain levels and improved functional mobility  Baseline: 28/80 Goal status: INITIAL    PLAN:  PT FREQUENCY: 1x/week  PT DURATION: 6 weeks (POC written for 8 weeks due to delay in scheduling)   PLANNED INTERVENTIONS: Therapeutic exercises, Therapeutic activity, Neuromuscular re-education, Balance training, Gait training, Patient/Family education, Self Care, Joint mobilization, Stair training, Orthotic/Fit training, Aquatic Therapy, Dry Needling, Electrical stimulation, scar mobilization, Taping, Manual therapy, and Re-evaluation  PLAN FOR NEXT SESSION:  gait speed w/SPC and update goal. Check BP. Desensitization techniques, tens unit, single leg stability. Add DF to HEP. Has pt been working on scar mobilization?   Check all possible CPT codes: 81191 - PT Re-evaluation, 97110- Therapeutic Exercise, [phone]- Neuro Re-education, [phone] - Gait Training, [phone] - Manual Therapy, [phone] - Therapeutic Activities, [phone] - Self Care, [phone] - Electrical stimulation (Manual), and [id number] - Aquatic therapy    Check all conditions that are expected to impact treatment: Cognitive Impairment or Intellectual disability and Social determinants of health   If treatment provided at initial evaluation, no treatment charged due to lack of  authorization.     Sadie Haber, PT, DPT 07/12/2023, 10:15 AM

## 2023-07-13 ENCOUNTER — Other Ambulatory Visit: Payer: Self-pay | Admitting: Family Medicine

## 2023-07-13 DIAGNOSIS — R1030 Lower abdominal pain, unspecified: Secondary | ICD-10-CM

## 2023-07-19 ENCOUNTER — Other Ambulatory Visit: Payer: Medicaid Other

## 2023-07-19 ENCOUNTER — Ambulatory Visit: Payer: Medicaid Other | Admitting: Physical Therapy

## 2023-07-26 ENCOUNTER — Ambulatory Visit: Payer: Medicaid Other | Attending: Family Medicine | Admitting: Physical Therapy

## 2023-07-26 VITALS — BP 155/80 | HR 67

## 2023-07-26 DIAGNOSIS — M79672 Pain in left foot: Secondary | ICD-10-CM | POA: Diagnosis present

## 2023-07-26 DIAGNOSIS — R2681 Unsteadiness on feet: Secondary | ICD-10-CM | POA: Insufficient documentation

## 2023-07-26 DIAGNOSIS — M6281 Muscle weakness (generalized): Secondary | ICD-10-CM | POA: Insufficient documentation

## 2023-07-26 NOTE — Therapy (Signed)
OUTPATIENT PHYSICAL THERAPY LOWER EXTREMITY TREATMENT   Patient Name: Monique Key MRN: [id number] DOB:07/04/1961, 62 y.o., female Today's Date: 07/26/2023  END OF SESSION:  PT End of Session - 07/26/23 1019     Visit Number 4    Number of Visits 7    Date for PT Re-Evaluation 08/09/23    Authorization Type [employer] Medicaid    PT Start Time 1018    PT Stop Time 1058    PT Time Calculation (min) 40 min    Activity Tolerance Patient tolerated treatment well    Behavior During Therapy WFL for tasks assessed/performed               No past medical history on file. No past surgical history on file. Patient Active Problem List   Diagnosis Date Noted   Acute cystitis without hematuria 01/19/2021   Cocaine use disorder, severe, dependence (HCC) 01/19/2021   Tetrahydrocannabinol (THC) use disorder, mild, abuse 01/19/2021   Alcohol use disorder, severe, dependence (HCC) 01/17/2021    [facility]   REFERRING PROVIDER: Edwin Cap, DPM  REFERRING DIAG: [id number] (ICD-10-CM) - Chronic pain in left foot G57.92 (ICD-10-CM) - Peripheral neuritis of left foot G57.82 (ICD-10-CM) - Nerve entrapment of lower limb, left  THERAPY DIAG:  Pain in left foot  Unsteadiness on feet  Muscle weakness (generalized)  Rationale for Evaluation and Treatment: Rehabilitation  ONSET DATE: 05/30/2023 (referral)   SUBJECTIVE:   SUBJECTIVE STATEMENT: Pt ambulated into clinic w/o cane. States she left it in the car. Pt reports she has been working on touching her foot and her exercises. No new falls.   PERTINENT HISTORY:polysubstance abuse, fibroma removal L foot on 04/23/22  PAIN:  Are you having pain? Yes: NPRS scale: 6/10 Pain location: L foot  Pain description: shooting, burning, stabbing Aggravating factors: Walking, rain  Relieving factors: Pain meds; warm water  PRECAUTIONS: Fall  RED FLAGS: None   WEIGHT BEARING RESTRICTIONS: No  FALLS:  Has patient fallen in  last 6 months? Yes. Number of falls >12   LIVING ENVIRONMENT: Lives with: lives in a boarding home Lives in: House/apartment Stairs: Yes: External: 2 sets of 6 steps; none Has following equipment at home: Single point cane  OCCUPATION: On disability   PLOF: Independent  PATIENT GOALS: "to get better on this foot and stop falling"   NEXT MD VISIT: N/A  OBJECTIVE:    COGNITION: Overall cognitive status: Impaired     SENSATION: Pt reports numbness/tingling in her first and second metatarsals of L foot and a shooting sensation eminating from her surgical incision along medial arch of foot   EDEMA: pt reports frequent swelling of hallux of L foot    POSTURE: rounded shoulders and forward head  PALPATION: Extremely TTP along L first metatarsal and surgical incision. Pt unable to tolerate more than light touch to these areas and required mod A to stabilize foot and pt pulling foot away. Pt unable to tolerate flexion of DIP of first toe and yelled w/movement.    LOWER EXTREMITY MMT: Tested in long sitting position   MMT Right eval Left eval  Hip flexion    Hip extension    Hip abduction    Hip adduction    Hip internal rotation    Hip external rotation    Knee flexion    Knee extension    Ankle dorsiflexion  2- (pain)  Ankle plantarflexion    Ankle inversion    Ankle eversion     (  Blank rows = not tested)  GAIT: Distance walked: Various clinic distances  Assistive device utilized: None Level of assistance: SBA Comments: Pt w/antalgic gait pattern on L foot and relying on furniture walking to stabilize through clinic. Pt reports she typically uses a cane but left it at home   VITALS  Vitals:   07/26/23 1023  BP: (!) 155/80  Pulse: 67      TODAY'S TREATMENT:       Ther Act  Assessed vitals (see above) and systolic BP elevated but within limits for therapy.   Ther Ex  SciFit multi-peaks level 7.5 for 8 minutes using BLEs only for neural priming for  reciprocal movement, dynamic cardiovascular warmup and improved ROM of bilateral ankles. RPE of 7.5/10 following activity. Pt reported feeling much better on her feel following activity.  Standing calf stretch at wall, x60s per side. Added to HEP (see bolded below)  Seated resisted ankle PF/DF w/red theraband w/slow eccentric contraction, x20 reps. Added to HEP (see bolded below) On rocker board in A/P direction w/o UE support:  Standing x2 minutes w/A/P rocks for improved ankle ROM and ankle strategy. No LOB noted  Alt single cone taps w/light BUE support x10 reps per side progressing to no UE support x10 reps per side. Increased difficulty tapping w/LLE, but no LOB noted  Mini squats w/no UE support, x20 reps.   Gait pattern: step through pattern, decreased stance time- Left, decreased stride length, and antalgic Distance walked: Various clinic distances  Assistive device utilized: None Level of assistance: Modified independence and SBA Comments: Pt ambulated into clinic w/step-to antalgic pattern due to L foot pain, requriing SBA for safety. By end of session, pt ambulated w/step-through pattern at mod I level                                                                                                                         PATIENT EDUCATION:  Education details: Additions to HEP  Person educated: Patient Education method: Explanation, Demonstration, and Handouts Education comprehension: verbalized understanding, returned demonstration, and needs further education  HOME EXERCISE PROGRAM: Access Code: [email or web address] URL: [email or web address] Date: 06/14/2023 Prepared by: Alethia Berthold Dea Bitting  Program Notes Foot Desensitization -Begin rubbing your left foot with a cotton ball or soft blanket-Work up to rubbing your foot with your hands for 3-5 minutes at a time   Exercises - Seated Toe Towel Scrunches  - 1 x daily - 7 x weekly - 3 sets - 10 reps - Calf stretch  - 1 x daily -  7 x weekly - 3 sets - 45-60 second hold - Seated Ankle Plantarflexion with Resistance  - 1 x daily - 7 x weekly - 3 sets - 10 reps - Ankle Dorsiflexion with Resistance  - 1 x daily - 7 x weekly - 3 sets - 10 reps  ASSESSMENT:  CLINICAL IMPRESSION: Emphasis of skilled PT session on improved ankle mobility on LLE, single leg stability and facilitation of ankle strategy  for balance. Pt not using cane this date and initially demonstrated antalgic gait pattern than did dissipate w/light stretching and exercises by end of session. Pt reports she feels as though her exercises have been helpful and her balance is improving. Continue POC.    OBJECTIVE IMPAIRMENTS: Abnormal gait, decreased activity tolerance, decreased balance, decreased cognition, decreased coordination, decreased endurance, decreased knowledge of use of DME, decreased mobility, difficulty walking, decreased strength, decreased safety awareness, impaired sensation, and pain  ACTIVITY LIMITATIONS: carrying, lifting, standing, squatting, stairs, bed mobility, hygiene/grooming, and locomotion level  PARTICIPATION LIMITATIONS: cleaning, interpersonal relationship, driving, shopping, community activity, and yard work  PERSONAL FACTORS: Behavior pattern, Past/current experiences, Social background, Time since onset of injury/illness/exacerbation, and 1 comorbidity: L peripheral neuropathy  are also affecting patient's functional outcome.   REHAB POTENTIAL: Fair due to lack of social support, reports of unsafe living environment and allodynia of L foot   CLINICAL DECISION MAKING: Stable/uncomplicated  EVALUATION COMPLEXITY: Low   GOALS: Goals reviewed with patient? Yes  SHORT TERM GOALS: Target date: 07/12/2023  Pt will be independent with initial HEP for improved strength, balance, desensitization and gait.  Baseline: established on eval  Goal status: MET   2.  Gait speed to be assessed w/SPC and LTG updated Baseline:  Goal  status: DC  3.  Pt will be compliant w/scar mobilization techniques for improved weightbearing tolerance on L foot and reduced fall risk  Baseline:  Goal status: MET  4.  LEFS to be assessed and LTG updated  Baseline: 28/80 Goal status: MET   LONG TERM GOALS: Target date: 08/02/2023 (updated to match last appointment date)    Pt will be independent with final HEP for improved strength, balance, desensitization and gait.  Baseline:  Goal status: INITIAL  2.  Gait speed goal  Baseline:  Goal status: DC as it was not assessed   3.  Pt will improve LEFS to >/= 41/80 for reduced pain levels and improved functional mobility  Baseline: 28/80 Goal status: INITIAL    PLAN:  PT FREQUENCY: 1x/week  PT DURATION: 6 weeks (POC written for 8 weeks due to delay in scheduling)   PLANNED INTERVENTIONS: Therapeutic exercises, Therapeutic activity, Neuromuscular re-education, Balance training, Gait training, Patient/Family education, Self Care, Joint mobilization, Stair training, Orthotic/Fit training, Aquatic Therapy, Dry Needling, Electrical stimulation, scar mobilization, Taping, Manual therapy, and Re-evaluation  PLAN FOR NEXT SESSION:  LTG assessment and DC.Check BP. Desensitization techniques, tens unit, single leg stability.   Check all possible CPT codes: 69629 - PT Re-evaluation, 97110- Therapeutic Exercise, [phone]- Neuro Re-education, [phone] - Gait Training, [phone] - Manual Therapy, [phone] - Therapeutic Activities, [phone] - Self Care, [phone] - Electrical stimulation (Manual), and [id number] - Aquatic therapy    Check all conditions that are expected to impact treatment: Cognitive Impairment or Intellectual disability and Social determinants of health   If treatment provided at initial evaluation, no treatment charged due to lack of authorization.     Jill Alexanders Teniyah Seivert, PT, DPT 07/26/2023, 10:58 AM

## 2023-07-28 ENCOUNTER — Inpatient Hospital Stay: Payer: Medicaid Other

## 2023-07-28 ENCOUNTER — Inpatient Hospital Stay: Payer: Medicaid Other | Attending: Hematology | Admitting: Hematology

## 2023-07-28 VITALS — BP 167/93 | HR 85 | Temp 98.7°F | Resp 18 | Wt 142.0 lb

## 2023-07-28 DIAGNOSIS — F17210 Nicotine dependence, cigarettes, uncomplicated: Secondary | ICD-10-CM | POA: Insufficient documentation

## 2023-07-28 DIAGNOSIS — D72829 Elevated white blood cell count, unspecified: Secondary | ICD-10-CM | POA: Diagnosis not present

## 2023-07-28 DIAGNOSIS — D751 Secondary polycythemia: Secondary | ICD-10-CM

## 2023-07-28 DIAGNOSIS — F319 Bipolar disorder, unspecified: Secondary | ICD-10-CM | POA: Insufficient documentation

## 2023-07-28 DIAGNOSIS — R200 Anesthesia of skin: Secondary | ICD-10-CM | POA: Diagnosis not present

## 2023-07-28 DIAGNOSIS — K5900 Constipation, unspecified: Secondary | ICD-10-CM | POA: Insufficient documentation

## 2023-07-28 DIAGNOSIS — Z791 Long term (current) use of non-steroidal anti-inflammatories (NSAID): Secondary | ICD-10-CM | POA: Diagnosis not present

## 2023-07-28 DIAGNOSIS — K219 Gastro-esophageal reflux disease without esophagitis: Secondary | ICD-10-CM | POA: Insufficient documentation

## 2023-07-28 DIAGNOSIS — R778 Other specified abnormalities of plasma proteins: Secondary | ICD-10-CM | POA: Diagnosis not present

## 2023-07-28 DIAGNOSIS — Z79899 Other long term (current) drug therapy: Secondary | ICD-10-CM | POA: Diagnosis not present

## 2023-07-28 DIAGNOSIS — I10 Essential (primary) hypertension: Secondary | ICD-10-CM | POA: Insufficient documentation

## 2023-07-28 DIAGNOSIS — R634 Abnormal weight loss: Secondary | ICD-10-CM | POA: Diagnosis not present

## 2023-07-28 NOTE — Progress Notes (Signed)
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   HISTORY OF PRESENTING ILLNESS:  Monique Key is a wonderful 62 y.o. 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   She reports that over the last 6 months, she has been frequently falling. Patient reports an episode in the shower and another in her room. She reports that her falls occur randomly and not in the dark.  She complains of numbness in her feet and was previously told she had neuropathy. Patient did have left foot surgery on August 6th. She notes that the inner side of her left foot has been numb since surgery with tingling. Patient is now engaging in physical therapy, which has improved her symptoms some. She currently has minimal feeling in her toes and still has pain in her feet as well. She also has pain in her right foot. She denies any other factors affecting her balance.   Patient reports having severe stomach issues causing her to hurl over due to pain. Patient had an [place] in August. She has lost 20 pounds in 2 months. She has not been seen by GI and is unsure if she was reffered by her PCP.   She reports that her bowel habits vary. She notes frequent constipation issues and sometimes diarrhea. Patient does have dark stools. She had a colonoscopy 3 years ago, which showed Hemorrhoids , and no other concerns. Patient did not receive an endoscopy at that time. She does not have a GI doctor at this time.   She consumes alcohol every other day, and reports drinking four 16-ounce drinks at a time. Patient reports that she drinks when feeling depressed. Her depression is driven by her health issues. She also reports bipolar disorder. She continues to take half a tablet of Aripiprazole managed by her  PCP. She has no liver issues.   She does smoke half a pack a day at this time. Patient does use  Marijuana sometimes every other day, and reports that she typically smokes "one small joint". Patient snorts Cocaine sometimes, and notes that she did yesterday.   She has not been any drinking water in the last couple of days, but has consumed juice, such as Sunny D and cranberry juice. She reports that she drinks water intermittently, and drinks 6-8 bottles of water normally.  She denies any history of blood clots in the past.   She reports that since turning 60, her spirit has went down. She reports that she is consideing therapy. She reports feeling alone and calls a 1-800 number sometimes to discuss her feelings. Several of her family members live in [place]. She reports that she was previously seen by a therapist 2.5-3 years ago. Patient also saw a different therapist in another location for some time.   Patient notes that her daughter is generally very busy and her daughter's father passed away from lung cancer last year. Pt reports that she does not want to bother her daughter.   Patient denies any unexplained fever, chills, drenching night sweats, back pain, or leg swelling. She does not take oxycodone.   She did have a previous yeast vaginal infection.   MEDICAL HISTORY:  No past medical history on file.  SURGICAL HISTORY: No past surgical history on file.   SOCIAL HISTORY: Social History  Socioeconomic History   Marital status: Single    Spouse name: Not on file   Number of children: Not on file   Years of education: Not on file   Highest education level: Not on file  Occupational History   Not on file  Tobacco Use   Smoking status: Every Day    Current packs/day: 0.50    Average packs/day: 0.5 packs/day for 20.0 years (10.0 ttl pk-yrs)    Types: Cigarettes   Smokeless tobacco: Never  Vaping Use   Vaping status: Never Used  Substance and Sexual Activity   Alcohol  use: Not on file   Drug use: Not Currently   Sexual activity: Not on file  Other Topics Concern   Not on file  Social History Narrative   Not on file   Social Determinants of Health   Financial Resource Strain: Not on file  Food Insecurity: Not on file  Transportation Needs: Not on file  Physical Activity: Not on file  Stress: Not on file  Social Connections: Not on file  Intimate Partner Violence: Not on file     FAMILY HISTORY: No family history on file.  ALLERGIES:  is allergic to multihance [gadobenate] and coconut fatty acid.  MEDICATIONS:  Current Outpatient Medications  Medication Sig Dispense Refill   amLODipine (NORVASC) 10 MG tablet Take by mouth.     ARIPiprazole (ABILIFY) 10 MG tablet Take 10 mg by mouth daily.     atorvastatin (LIPITOR) 20 MG tablet Take 20 mg by mouth daily.     clobetasol cream (TEMOVATE) 0.05 % Apply 1 Application topically 2 (two) times daily. 30 g 0   gabapentin (NEURONTIN) 300 MG capsule TAKE 1 CAPSULE BY MOUTH TWICE DAILY WITH BREAKFAST AND LUNCH AND 2 AT BEDTIME. 120 capsule 0   hydrOXYzine (VISTARIL) 25 MG capsule Take 25 mg by mouth 3 (three) times daily as needed.     lidocaine (XYLOCAINE) 5 % ointment Apply 1 Application topically as needed. 35.44 g 0   meloxicam (MOBIC) 15 MG tablet Take 15 mg by mouth daily.     pantoprazole (PROTONIX) 40 MG tablet Take 40 mg by mouth daily.     traMADol (ULTRAM) 50 MG tablet Take 50 mg by mouth every 6 (six) hours as needed for severe pain.     No current facility-administered medications for this visit.    REVIEW OF SYSTEMS:    10 Point review of Systems was done is negative except as noted above.  PHYSICAL EXAMINATION: ECOG PERFORMANCE STATUS: {CHL ONC ECOG VH:[id number]}  .There were no vitals filed for this visit. There were no vitals filed for this visit. .There is no height or weight on file to calculate BMI.  GENERAL:alert, in no acute distress and comfortable SKIN: no acute  rashes, no significant lesions EYES: conjunctiva are pink and non-injected, sclera anicteric OROPHARYNX: MMM, no exudates, no oropharyngeal erythema or ulceration NECK: supple, no JVD LYMPH:  no palpable lymphadenopathy in the cervical, axillary or inguinal regions LUNGS: clear to auscultation b/l with normal respiratory effort HEART: regular rate & rhythm ABDOMEN:  normoactive bowel sounds , non tender, not distended. Extremity: no pedal edema PSYCH: alert & oriented x 3 with fluent speech NEURO: no focal motor/sensory deficits  LABORATORY DATA:  I have reviewed the data as listed  .    Latest Ref Rng & Units 11/12/2010   10:23 AM 01/14/2010    9:07 PM  CBC  WBC 4.0 - 10.5 10*3/microliter  15.0  Hemoglobin 12.0 - 15.0 g/dL 78.2  95.6   Hematocrit 36.0 - 46.0 % 49.0  45.1   Platelets 150 - 400 K/uL  382     .    Latest Ref Rng & Units 11/12/2010   10:23 AM  CMP  Glucose 70 - 99 mg/dL 213   BUN 6 - 23 mg/dL 3   Creatinine 0.4 - 1.2 mg/dL 0.9   Sodium 086 - 578 mEq/L 139   Potassium 3.5 - 5.1 mEq/L 4.4   Chloride 96 - 112 mEq/L 103    CMP 04/19/2023:   CBC 04/19/2023:    RADIOGRAPHIC STUDIES: I have personally reviewed the radiological images as listed and agreed with the findings in the report. No results found.  ASSESSMENT & PLAN:  62 y.o. female with:  polycythemia vera  PLAN:  -CBC from 04/19/2023 showed WBC of 16.5K, hgb of 17.6, and platelets of 371K -WBC elevated with leukocytosis  -platelets normal -discussed goal to determine whether elevated blood counts are due to a bone marrow disorder or is part of a process involving a reaction to a secondary factor such as infection, inflammation, smoking, drugs, etc -I suspect that there is most likely a secondary process involved and less likely to be a primary bone marrow disorder -CMP showed high calcium and high total protein -discussed that the medications she is taking, including Aripiprazole, is not  compatible with the amount of alcohol she is consuming. Discussed that the combination could be lethal.  -discussed that her abdominal symptoms may be related to alcohol causing gastritis, or cocaine affecting the bowels -educated patient that alcohol consumption may be further affecting the nerves in her legs -discussed that dehydration may cause elevated RBC -will order blood tests for further evaluation -will check to make sure there is no abnormal protein in the blood given that her calcium and protein levels were high -recommend patient to connect with her PCP to consider referral to therapist.  -discussed the need for her 20-pound weight loss and abdominal symptoms to be evaluated by GI. PCP shall consider referral.   FOLLOW-UP: labs today Phone visit with Dr Candise Che in 2 weeks  The total time spent in the appointment was *** minutes* .  All of the patient's questions were answered with apparent satisfaction. The patient knows to call the clinic with any problems, questions or concerns.   Wyvonnia Lora MD MS AAHIVMS [facility] Hematology/Oncology Physician [facility]  .*Total Encounter Time as defined by the Centers for Medicare and Medicaid Services includes, in addition to the face-to-face time of a patient visit (documented in the note above) non-face-to-face time: obtaining and reviewing outside history, ordering and reviewing medications, tests or procedures, care coordination (communications with other health care professionals or caregivers) and documentation in the medical record.    I,Mitra Faeizi,acting as a Neurosurgeon for Wyvonnia Lora, MD.,have documented all relevant documentation on the behalf of Wyvonnia Lora, MD,as directed by  Wyvonnia Lora, MD while in the presence of Wyvonnia Lora, MD.  ***

## 2023-08-01 ENCOUNTER — Other Ambulatory Visit: Payer: Self-pay

## 2023-08-01 ENCOUNTER — Other Ambulatory Visit: Payer: Self-pay | Admitting: Hematology

## 2023-08-01 DIAGNOSIS — D751 Secondary polycythemia: Secondary | ICD-10-CM

## 2023-08-02 ENCOUNTER — Ambulatory Visit: Payer: Medicaid Other | Admitting: Physical Therapy

## 2023-08-02 ENCOUNTER — Other Ambulatory Visit: Payer: Medicaid Other

## 2023-08-03 ENCOUNTER — Inpatient Hospital Stay: Payer: Medicaid Other

## 2023-08-09 ENCOUNTER — Inpatient Hospital Stay: Payer: Medicaid Other | Admitting: Hematology

## 2023-08-22 NOTE — Progress Notes (Signed)
This encounter was created in error - please disregard.

## 2023-09-06 ENCOUNTER — Other Ambulatory Visit: Payer: Self-pay | Admitting: Family Medicine

## 2023-09-06 ENCOUNTER — Other Ambulatory Visit (HOSPITAL_COMMUNITY)
Admission: RE | Admit: 2023-09-06 | Discharge: 2023-09-06 | Disposition: A | Discharge status: Home / Self Care | Payer: Medicaid Other | Source: Ambulatory Visit | Attending: Family Medicine | Admitting: Family Medicine

## 2023-09-06 DIAGNOSIS — Z0001 Encounter for general adult medical examination with abnormal findings: Secondary | ICD-10-CM | POA: Diagnosis present

## 2023-09-06 DIAGNOSIS — Z113 Encounter for screening for infections with a predominantly sexual mode of transmission: Secondary | ICD-10-CM | POA: Diagnosis present

## 2023-09-07 LAB — MOLECULAR ANCILLARY ONLY
Bacterial Vaginitis (gardnerella): NEGATIVE
Candida Glabrata: NEGATIVE
Candida Vaginitis: NEGATIVE
Chlamydia: NEGATIVE
Comment: NEGATIVE
Comment: NEGATIVE
Comment: NEGATIVE
Comment: NEGATIVE
Comment: NEGATIVE
Comment: NORMAL
Neisseria Gonorrhea: NEGATIVE
Trichomonas: NEGATIVE

## 2023-09-09 LAB — CYTOLOGY - PAP
Adequacy: ABSENT
Comment: NEGATIVE
Comment: NEGATIVE
Diagnosis: NEGATIVE
HSV1: NEGATIVE
HSV2: NEGATIVE
High risk HPV: NEGATIVE

## 2023-11-01 ENCOUNTER — Ambulatory Visit: Payer: Medicaid Other | Admitting: Podiatry

## 2023-11-01 ENCOUNTER — Inpatient Hospital Stay: Payer: Medicaid Other | Attending: Hematology

## 2023-11-15 ENCOUNTER — Ambulatory Visit: Payer: Medicaid Other | Admitting: Podiatry

## 2023-11-22 ENCOUNTER — Inpatient Hospital Stay: Payer: Medicaid Other | Attending: Hematology | Admitting: Hematology

## 2023-11-22 NOTE — Progress Notes (Shared)
 HEMATOLOGY/ONCOLOGY CLINIC NOTE  Date of Service: 11/22/2023  Patient Care Team: Parke Simmers, Clinic as PCP - General  CHIEF COMPLAINTS/PURPOSE OF CONSULTATION:  Evaluation and management of polycythemia   HISTORY OF PRESENTING ILLNESS:   Monique Key is a wonderful 63 y.o. female who has been referred to [place] by Derenda Mis, FNP for evaluation and management of polycythemia.   She reports that over the last 6 months, she has been frequently falling. Patient reports an episode in the shower and another in her room. She reports that her falls occur randomly and not in the dark.  She complains of numbness in her feet and was previously told she had neuropathy. Patient did have left foot surgery on August 6th. She notes that the inner side of her left foot has been numb since surgery with tingling. Patient is now engaging in physical therapy, which has improved her symptoms some. She currently has minimal feeling in her toes and still has pain in her feet as well. She also has pain in her right foot. She denies any other factors affecting her balance.   Patient reports having severe stomach issues causing her to bend over over due to pain. Patient had an [place] in August. She has lost 20 pounds in 2 months. She has not been seen by GI and is unsure if she was referred by her PCP.   She reports that her bowel habits vary. She notes frequent constipation issues and sometimes diarrhea. Patient does have dark stools. She had a colonoscopy 3 years ago, which showed Hemorrhoids , and no other concerns. Patient did not receive an endoscopy at that time. She does not have a GI doctor at this time.   She consumes alcohol every other day, and reports drinking four 16-ounce drinks at a time. Patient reports that she drinks when feeling depressed. Her depression is driven by her health issues. She also reports bipolar disorder. She continues to take half a tablet of Aripiprazole managed by her PCP. She has  no liver issues.   She does smoke half a pack a day at this time. Patient does use  Marijuana sometimes every other day, and reports that she typically smokes "one small joint". Patient snorts Cocaine sometimes, and notes that she did yesterday.   She has not been any drinking water in the last couple of days, but has consumed juice, such as Sunny D and cranberry juice. She reports that she drinks water intermittently, and drinks 6-8 bottles of water normally.  She denies any history of blood clots in the past.   She reports that since turning 60, her spirit has went down. She reports that she is consideing therapy. She reports feeling alone and calls a 1-800 number sometimes to discuss her feelings. Several of her family members live in [place]. She reports that she was previously seen by a therapist 2.5-3 years ago. Patient also saw a different therapist in another location for some time.   Patient notes that her daughter is generally very busy and her daughter's father passed away from lung cancer last year. Pt reports that she does not want to bother her daughter.   Patient denies any unexplained fever, chills, drenching night sweats, back pain, or leg swelling. She does not take oxycodone.   She did have a previous yeast vaginal infection.   INTERVAL HISTORY: Monique Key is a wonderful 63 y.o. female who is here for continued evaluation and management of polycythemia. She was initially seen by  me on 07/28/2023.    -Discussed lab results from today, 11/22/2023, in detail with the patient.    MEDICAL HISTORY:  Depression Cigarette smoker THC use HTN GERD  SURGICAL HISTORY: No past surgical history on file.   SOCIAL HISTORY: Social History   Socioeconomic History   Marital status: Single    Spouse name: Not on file   Number of children: Not on file   Years of education: Not on file   Highest education level: Not on file  Occupational History   Not on file  Tobacco  Use   Smoking status: Every Day    Current packs/day: 0.50    Average packs/day: 0.5 packs/day for 20.0 years (10.0 ttl pk-yrs)    Types: Cigarettes   Smokeless tobacco: Never  Vaping Use   Vaping status: Never Used  Substance and Sexual Activity   Alcohol use: Not on file   Drug use: Not Currently   Sexual activity: Not on file  Other Topics Concern   Not on file  Social History Narrative   Not on file   Social Drivers of Health   Financial Resource Strain: Not on file  Food Insecurity: Not on file  Transportation Needs: Not on file  Physical Activity: Not on file  Stress: Not on file  Social Connections: Not on file  Intimate Partner Violence: Not on file     FAMILY HISTORY: No family history on file.  ALLERGIES:  is allergic to multihance [gadobenate] and coconut fatty acid.  MEDICATIONS:  Current Outpatient Medications  Medication Sig Dispense Refill   amLODipine (NORVASC) 10 MG tablet Take by mouth.     ARIPiprazole (ABILIFY) 10 MG tablet Take 10 mg by mouth daily.     atorvastatin (LIPITOR) 20 MG tablet Take 20 mg by mouth daily.     clobetasol cream (TEMOVATE) 0.05 % Apply 1 Application topically 2 (two) times daily. (Patient not taking: Reported on 07/28/2023) 30 g 0   gabapentin (NEURONTIN) 300 MG capsule TAKE 1 CAPSULE BY MOUTH TWICE DAILY WITH BREAKFAST AND LUNCH AND 2 AT BEDTIME. 120 capsule 0   hydrOXYzine (VISTARIL) 25 MG capsule Take 25 mg by mouth 3 (three) times daily as needed.     lidocaine (XYLOCAINE) 5 % ointment Apply 1 Application topically as needed. 35.44 g 0   meloxicam (MOBIC) 15 MG tablet Take 15 mg by mouth daily.     pantoprazole (PROTONIX) 40 MG tablet Take 40 mg by mouth daily.     traMADol (ULTRAM) 50 MG tablet Take 50 mg by mouth every 6 (six) hours as needed for severe pain. (Patient not taking: Reported on 07/28/2023)     Vitamin D, Ergocalciferol, (DRISDOL) 1.25 MG (50000 UNIT) CAPS capsule Take 50,000 Units by mouth once a week.      No current facility-administered medications for this visit.    REVIEW OF SYSTEMS:    10 Point review of Systems was done is negative except as noted above.  PHYSICAL EXAMINATION: ECOG PERFORMANCE STATUS: 1 - Symptomatic but completely ambulatory  . There were no vitals filed for this visit.  There were no vitals filed for this visit.  .There is no height or weight on file to calculate BMI.  GENERAL:alert, in no acute distress and comfortable SKIN: no acute rashes, no significant lesions EYES: conjunctiva are pink and non-injected, sclera anicteric OROPHARYNX: MMM, no exudates, no oropharyngeal erythema or ulceration NECK: supple, no JVD LYMPH:  no palpable lymphadenopathy in the cervical, axillary or inguinal regions  LUNGS: clear to auscultation b/l with normal respiratory effort HEART: regular rate & rhythm ABDOMEN:  normoactive bowel sounds , non tender, not distended. Extremity: no pedal edema PSYCH: alert & oriented x 3 with fluent speech NEURO: no focal motor/sensory deficits  LABORATORY DATA:  I have reviewed the data as listed  .    Latest Ref Rng & Units 11/12/2010   10:23 AM 01/14/2010    9:07 PM  CBC  WBC 4.0 - 10.5 10*3/microliter  15.0   Hemoglobin 12.0 - 15.0 g/dL 16.1  09.6   Hematocrit 36.0 - 46.0 % 49.0  45.1   Platelets 150 - 400 K/uL  382     .    Latest Ref Rng & Units 11/12/2010   10:23 AM  CMP  Glucose 70 - 99 mg/dL 045   BUN 6 - 23 mg/dL 3   Creatinine 0.4 - 1.2 mg/dL 0.9   Sodium 409 - 811 mEq/L 139   Potassium 3.5 - 5.1 mEq/L 4.4   Chloride 96 - 112 mEq/L 103    CMP 04/19/2023:   CBC 04/19/2023:    RADIOGRAPHIC STUDIES: I have personally reviewed the radiological images as listed and agreed with the findings in the report. No results found.  ASSESSMENT & PLAN:  63 y.o. female with:  Polycythemia - ? PCV vs secondary polycythemia  PLAN:  -CBC from 04/19/2023 showed WBC of 16.5K, hgb of 17.6, and platelets of 371K -WBC  elevated with leukocytosis  -platelets normal -discussed goal to determine whether elevated blood counts are due to a bone marrow disorder or is part of a process involving a reaction to a secondary factor such as infection, inflammation, smoking, drugs, etc -I suspect that there is most likely a secondary process involved and less likely to be a primary bone marrow disorder -CMP showed high calcium and high total protein -discussed that the medications she is taking, including Aripiprazole, is not compatible with the amount of alcohol she is consuming. Discussed that the combination could be lethal.  -discussed that her abdominal symptoms may be related to alcohol causing gastritis, or cocaine affecting the bowels -educated patient that alcohol consumption may be further affecting the nerves in her legs -discussed that dehydration may cause elevated RBC -will order blood tests for further evaluation -will check to make sure there is no abnormal protein in the blood given that her calcium and protein levels were high -recommend patient to connect with her PCP to consider referral to therapist.  -discussed the need for her 20-pound weight loss and abdominal symptoms to be evaluated by GI. PCP shall consider referral.   FOLLOW-UP: ***  The total time spent in the appointment was *** minutes* .  All of the patient's questions were answered with apparent satisfaction. The patient knows to call the clinic with any problems, questions or concerns.   Wyvonnia Lora MD MS AAHIVMS [facility] Hematology/Oncology Physician [facility]  .*Total Encounter Time as defined by the Centers for Medicare and Medicaid Services includes, in addition to the face-to-face time of a patient visit (documented in the note above) non-face-to-face time: obtaining and reviewing outside history, ordering and reviewing medications, tests or procedures, care coordination (communications with other health care  professionals or caregivers) and documentation in the medical record.   I,Param Shah,acting as a Neurosurgeon for Wyvonnia Lora, MD.,have documented all relevant documentation on the behalf of Wyvonnia Lora, MD,as directed by  Wyvonnia Lora, MD while in the presence of Wyvonnia Lora, MD.

## 2024-02-05 ENCOUNTER — Emergency Department (HOSPITAL_COMMUNITY)
Admission: EM | Admit: 2024-02-05 | Discharge: 2024-02-05 | Disposition: A | Discharge status: Other Acute Inpatient Hospital | Attending: Emergency Medicine | Admitting: Emergency Medicine

## 2024-02-05 ENCOUNTER — Other Ambulatory Visit: Payer: Self-pay

## 2024-02-05 ENCOUNTER — Encounter (HOSPITAL_COMMUNITY): Payer: Self-pay

## 2024-02-05 ENCOUNTER — Inpatient Hospital Stay
Admission: AD | Admit: 2024-02-05 | Discharge: 2024-02-10 | DRG: 885 | Disposition: A | Discharge status: Home / Self Care | Source: Intra-hospital | Attending: Psychiatry | Admitting: Psychiatry

## 2024-02-05 DIAGNOSIS — Z79899 Other long term (current) drug therapy: Secondary | ICD-10-CM

## 2024-02-05 DIAGNOSIS — Z791 Long term (current) use of non-steroidal anti-inflammatories (NSAID): Secondary | ICD-10-CM | POA: Diagnosis not present

## 2024-02-05 DIAGNOSIS — Z5901 Sheltered homelessness: Secondary | ICD-10-CM

## 2024-02-05 DIAGNOSIS — I10 Essential (primary) hypertension: Secondary | ICD-10-CM | POA: Diagnosis present

## 2024-02-05 DIAGNOSIS — F1210 Cannabis abuse, uncomplicated: Secondary | ICD-10-CM | POA: Diagnosis present

## 2024-02-05 DIAGNOSIS — Z5900 Homelessness unspecified: Secondary | ICD-10-CM | POA: Diagnosis not present

## 2024-02-05 DIAGNOSIS — F1020 Alcohol dependence, uncomplicated: Secondary | ICD-10-CM | POA: Diagnosis present

## 2024-02-05 DIAGNOSIS — Z9152 Personal history of nonsuicidal self-harm: Secondary | ICD-10-CM | POA: Diagnosis not present

## 2024-02-05 DIAGNOSIS — Z9102 Food additives allergy status: Secondary | ICD-10-CM | POA: Diagnosis not present

## 2024-02-05 DIAGNOSIS — F1010 Alcohol abuse, uncomplicated: Secondary | ICD-10-CM | POA: Diagnosis present

## 2024-02-05 DIAGNOSIS — Z9151 Personal history of suicidal behavior: Secondary | ICD-10-CM

## 2024-02-05 DIAGNOSIS — R45851 Suicidal ideations: Principal | ICD-10-CM | POA: Diagnosis present

## 2024-02-05 DIAGNOSIS — R440 Auditory hallucinations: Secondary | ICD-10-CM | POA: Diagnosis present

## 2024-02-05 DIAGNOSIS — F319 Bipolar disorder, unspecified: Principal | ICD-10-CM | POA: Diagnosis present

## 2024-02-05 DIAGNOSIS — E785 Hyperlipidemia, unspecified: Secondary | ICD-10-CM | POA: Diagnosis present

## 2024-02-05 DIAGNOSIS — F1420 Cocaine dependence, uncomplicated: Secondary | ICD-10-CM | POA: Diagnosis present

## 2024-02-05 DIAGNOSIS — Z91148 Patient's other noncompliance with medication regimen for other reason: Secondary | ICD-10-CM | POA: Diagnosis not present

## 2024-02-05 DIAGNOSIS — Z62810 Personal history of physical and sexual abuse in childhood: Secondary | ICD-10-CM | POA: Diagnosis not present

## 2024-02-05 DIAGNOSIS — Z91041 Radiographic dye allergy status: Secondary | ICD-10-CM | POA: Diagnosis not present

## 2024-02-05 DIAGNOSIS — Z604 Social exclusion and rejection: Secondary | ICD-10-CM | POA: Diagnosis present

## 2024-02-05 DIAGNOSIS — F17210 Nicotine dependence, cigarettes, uncomplicated: Secondary | ICD-10-CM | POA: Diagnosis present

## 2024-02-05 DIAGNOSIS — Z5941 Food insecurity: Secondary | ICD-10-CM | POA: Diagnosis not present

## 2024-02-05 DIAGNOSIS — Z91410 Personal history of adult physical and sexual abuse: Secondary | ICD-10-CM

## 2024-02-05 DIAGNOSIS — F1910 Other psychoactive substance abuse, uncomplicated: Secondary | ICD-10-CM | POA: Diagnosis not present

## 2024-02-05 DIAGNOSIS — F419 Anxiety disorder, unspecified: Secondary | ICD-10-CM | POA: Diagnosis present

## 2024-02-05 DIAGNOSIS — Z5982 Transportation insecurity: Secondary | ICD-10-CM

## 2024-02-05 DIAGNOSIS — K219 Gastro-esophageal reflux disease without esophagitis: Secondary | ICD-10-CM | POA: Diagnosis present

## 2024-02-05 LAB — COMPREHENSIVE METABOLIC PANEL WITH GFR
ALT: 15 U/L (ref 0–44)
AST: 20 U/L (ref 15–41)
Albumin: 4.1 g/dL (ref 3.5–5.0)
Alkaline Phosphatase: 66 U/L (ref 38–126)
Anion gap: 8 (ref 5–15)
BUN: 7 mg/dL — ABNORMAL LOW (ref 8–23)
CO2: 27 mmol/L (ref 22–32)
Calcium: 9.2 mg/dL (ref 8.9–10.3)
Chloride: 104 mmol/L (ref 98–111)
Creatinine, Ser: 0.62 mg/dL (ref 0.44–1.00)
GFR, Estimated: >60 mL/min (ref >60)
Glucose, Bld: 85 mg/dL (ref 70–99)
Potassium: 3.8 mmol/L (ref 3.5–5.1)
Sodium: 139 mmol/L (ref 135–145)
Total Bilirubin: 0.8 mg/dL (ref 0.0–1.2)
Total Protein: 7.4 g/dL (ref 6.5–8.1)

## 2024-02-05 LAB — CBC
HCT: 48.3 % — ABNORMAL HIGH (ref 36.0–46.0)
Hemoglobin: 16.1 g/dL — ABNORMAL HIGH (ref 12.0–15.0)
MCH: 31.1 pg (ref 26.0–34.0)
MCHC: 33.3 g/dL (ref 30.0–36.0)
MCV: 93.2 fL (ref 80.0–100.0)
Platelets: 337 10*3/uL (ref 150–400)
RBC: 5.18 MIL/uL — ABNORMAL HIGH (ref 3.87–5.11)
RDW: 13.3 % (ref 11.5–15.5)
WBC: 9.7 10*3/uL (ref 4.0–10.5)
nRBC: 0 % (ref 0.0–0.2)

## 2024-02-05 LAB — RAPID URINE DRUG SCREEN, HOSP PERFORMED
Amphetamines: NOT DETECTED
Barbiturates: NOT DETECTED
Benzodiazepines: NOT DETECTED
Cocaine: POSITIVE — AB
Opiates: NOT DETECTED
Tetrahydrocannabinol: POSITIVE — AB

## 2024-02-05 LAB — ETHANOL: Alcohol, Ethyl (B): <15 mg/dL (ref <15)

## 2024-02-05 MED ORDER — HYDROXYZINE HCL 25 MG PO TABS: 25.0000 mg | ORAL_TABLET | Freq: Three times a day (TID) | ORAL | Status: DC | PRN

## 2024-02-05 MED ORDER — OLANZAPINE 10 MG IM SOLR: 5.0000 mg | Freq: Three times a day (TID) | INTRAMUSCULAR | Status: DC | PRN

## 2024-02-05 MED ORDER — ALUM & MAG HYDROXIDE-SIMETH 200-200-20 MG/5ML PO SUSP: 30.0000 mL | ORAL | Status: DC | PRN

## 2024-02-05 MED ORDER — AMLODIPINE BESYLATE 5 MG PO TABS
10.0000 mg | ORAL_TABLET | Freq: Every day | ORAL | Status: DC
Start: 2024-02-05 — End: 2024-02-05
  Administered 2024-02-05: 10 mg via ORAL
  Filled 2024-02-05: qty 2

## 2024-02-05 MED ORDER — ATORVASTATIN CALCIUM 10 MG PO TABS
20.0000 mg | ORAL_TABLET | Freq: Every day | ORAL | Status: DC
  Administered 2024-02-05: 20 mg via ORAL
  Filled 2024-02-05: qty 2

## 2024-02-05 MED ORDER — GABAPENTIN 100 MG PO CAPS: 100.0000 mg | ORAL_CAPSULE | Freq: Two times a day (BID) | ORAL | Status: DC

## 2024-02-05 MED ORDER — OLANZAPINE 10 MG IM SOLR: 10.0000 mg | Freq: Three times a day (TID) | INTRAMUSCULAR | Status: DC | PRN

## 2024-02-05 MED ORDER — DICYCLOMINE HCL 20 MG PO TABS: 20.0000 mg | ORAL_TABLET | Freq: Four times a day (QID) | ORAL | Status: DC | PRN

## 2024-02-05 MED ORDER — MAGNESIUM HYDROXIDE 400 MG/5ML PO SUSP
30.0000 mL | Freq: Every day | ORAL | Status: DC | PRN
  Administered 2024-02-07 – 2024-02-09 (×3): 30 mL via ORAL
  Filled 2024-02-05 (×3): qty 30

## 2024-02-05 MED ORDER — ONDANSETRON 4 MG PO TBDP: 4.0000 mg | ORAL_TABLET | Freq: Four times a day (QID) | ORAL | Status: DC | PRN

## 2024-02-05 MED ORDER — PANTOPRAZOLE SODIUM 40 MG PO TBEC
40.0000 mg | DELAYED_RELEASE_TABLET | Freq: Every day | ORAL | Status: DC
  Administered 2024-02-05: 40 mg via ORAL
  Filled 2024-02-05: qty 1

## 2024-02-05 MED ORDER — METHOCARBAMOL 500 MG PO TABS
500.0000 mg | ORAL_TABLET | Freq: Three times a day (TID) | ORAL | Status: DC | PRN
  Administered 2024-02-09: 500 mg via ORAL
  Filled 2024-02-05: qty 1

## 2024-02-05 MED ORDER — HYDROXYZINE PAMOATE 25 MG PO CAPS: 25.0000 mg | ORAL_CAPSULE | Freq: Three times a day (TID) | ORAL | Status: DC | PRN

## 2024-02-05 MED ORDER — LOPERAMIDE HCL 2 MG PO CAPS: 2.0000 mg | ORAL_CAPSULE | ORAL | Status: DC | PRN

## 2024-02-05 MED ORDER — ACETAMINOPHEN 325 MG PO TABS
650.0000 mg | ORAL_TABLET | Freq: Four times a day (QID) | ORAL | Status: DC | PRN
  Administered 2024-02-07 – 2024-02-10 (×4): 650 mg via ORAL
  Filled 2024-02-05 (×4): qty 2

## 2024-02-05 MED ORDER — HYDROXYZINE HCL 25 MG PO TABS
25.0000 mg | ORAL_TABLET | Freq: Four times a day (QID) | ORAL | Status: DC | PRN
  Administered 2024-02-06 – 2024-02-09 (×4): 25 mg via ORAL
  Filled 2024-02-05 (×4): qty 1

## 2024-02-05 MED ORDER — NAPROXEN 500 MG PO TABS: 500.0000 mg | ORAL_TABLET | Freq: Two times a day (BID) | ORAL | Status: DC | PRN

## 2024-02-05 MED ORDER — OLANZAPINE 5 MG PO TBDP: 5.0000 mg | ORAL_TABLET | Freq: Three times a day (TID) | ORAL | Status: DC | PRN

## 2024-02-05 MED ORDER — ARIPIPRAZOLE 10 MG PO TABS
10.0000 mg | ORAL_TABLET | Freq: Every day | ORAL | Status: DC
  Administered 2024-02-05: 10 mg via ORAL
  Filled 2024-02-05: qty 1

## 2024-02-05 NOTE — Consult Note (Signed)
 [facility] Psychiatric Consult Initial  Patient Name: .Monique Key  MRN: [id number]  DOB: 08/17/1961  Consult Order details:  Orders (From admission, onward)     Start     Ordered   02/05/24 1433  CONSULT TO CALL ACT TEAM       Ordering Provider: Craige Dixon, MD  Provider:  (Not yet assigned)  Question:  Reason for Consult?  Answer:  Suicidal ideation, previous suicide attempt, also has problems with EtOH and drugs and she reports she quit last night.  Worsening hallucinations and has not been on medications for bipolar in several months   02/05/24 1433             Mode of Visit: Tele-visit Virtual Statement:TELE PSYCHIATRY ATTESTATION & CONSENT As the provider for this telehealth consult, I attest that I verified the patient's identity using two separate identifiers, introduced myself to the patient, provided my credentials, disclosed my location, and performed this encounter via a HIPAA-compliant, real-time, face-to-face, two-way, interactive audio and video platform and with the full consent and agreement of the patient (or guardian as applicable.) Patient physical location: [facility]. Telehealth provider physical location: home office in state of [place].   Video start time: 3:16 pm Video end time: 3:22 pm    Psychiatry Consult Evaluation  Service Date: Feb 05, 2024 LOS:  LOS: 0 days  Chief Complaint SI and alcohol abuse  Primary Psychiatric Diagnoses  Suicide ideation 2.  Alcohol abuse 3.  Polysubstance abuse  Assessment  Monique Key is a 63 y.o. female admitted: Presented to the [facility] 02/05/2024  8:52 AM for SI and alcohol abuse. She carries the psychiatric diagnoses of bipolar dx, ETOH, MDD, polysubstance abuse and has a past medical history of  see chart .   Diagnoses:  Active Hospital problems: Active Problems:   Alcohol abuse   Suicidal ideation   Polysubstance abuse (HCC)   Homelessness unspecified    Plan   ## Psychiatric Medication Recommendations:   Resume home medicine  ## Medical Decision Making Capacity: Not specifically addressed in this encounter  ## Further Work-up:  --  UDS -- most recent EKG on     ## Disposition:-- We recommend inpatient psychiatric hospitalization when medically cleared. Patient is under voluntary admission status at this time; please IVC if attempts to leave hospital.  ## Behavioral / Environmental: - No specific recommendations at this time.     ## Safety and Observation Level:  - Based on my clinical evaluation, I estimate the patient to be at low risk of self harm in the current setting. - At this time, we recommend  routine. This decision is based on my review of the chart including patient's history and current presentation, interview of the patient, mental status examination, and consideration of suicide risk including evaluating suicidal ideation, plan, intent, suicidal or self-harm behaviors, risk factors, and protective factors. This judgment is based on our ability to directly address suicide risk, implement suicide prevention strategies, and develop a safety plan while the patient is in the clinical setting. Please contact our team if there is a concern that risk level has changed.  CSSR Risk Category:C-SSRS RISK CATEGORY: Moderate Risk  Suicide Risk Assessment: Patient has following modifiable risk factors for suicide: active suicidal ideation, untreated depression, under treated depression , social isolation, recklessness, and medication noncompliance, which we are addressing by psychiatry. Patient has following non-modifiable or demographic risk factors for suicide: history of suicide attempt, history of self harm behavior, and psychiatric hospitalization Patient  has the following protective factors against suicide: Access to outpatient mental health care  Thank you for this consult request. Recommendations have been communicated to the primary team.  We will inpatient admission at this time.    Dorthea Gauze, NP       History of Present Illness  Relevant Aspects of Hospital ED Course:  Admitted on 02/05/2024 for SI and detox. .   Patient Report:  Patient was seen via telemedicine for psych consultation due to suicidal ideation, and requesting detox.  Patient is alert and oriented x 4, speech is clear, maintain eye contact.  Patient is observed sitting in the interview room patient rocking back and forth very fidgety unable to stay still.  At times patient behaviors that she was crying.  Patient currently endorsed suicidal ideation when asked what the plan she stated she was going to take pills.  Patient went on to say that 2 weeks ago she took pills and smoke drugs to make it stop.  When asked what is she talking about patient stated that the voices are telling her " I am going to get you, die bitch die".  Patient denies HI, currently patient does seem to be influenced by internal stimuli because patient stated while being interviewed please let them stop when asked whether she talking about she said the voices.  Patient reports she has not taken her medicine in about a week, patient reports that she used marijuana and cocaine the last time she used Cocaine was last night patient reports she drinks half of 1/5 of alcohol on a daily basis.  Last time was last night.  Patient currently reports she is homeless stating she has a daughter who lives in [place]  but they do not want anything to do with her.  Given patient prior history and current presentation writer discussed with patient that we will be admitting her inpatient for continued assessment and evaluation.  Patient is in agreement with plan of care.  Psych ROS:  Depression: Yes Anxiety: Yes Mania (lifetime and current): Unknown Psychosis: (lifetime and current): Unknown  Collateral information:  Contacted Review of Systems  Constitutional: Negative.   HENT: Negative.    Eyes: Negative.   Respiratory: Negative.     Cardiovascular: Negative.   Gastrointestinal: Negative.   Genitourinary: Negative.   Musculoskeletal: Negative.   Skin: Negative.   Neurological: Negative.   Psychiatric/Behavioral:  Positive for depression, substance abuse and suicidal ideas. The patient is nervous/anxious.      Psychiatric and Social History  Psychiatric History:  Information collected from patient and chart review  Prev Dx/Sx: Bipolar disorder, suicidal ideation, alcohol abuse, homelessness Current Psych Provider: Denies having 1 currently Home Meds (current): Yes but is noncompliant Previous Med Trials: Unknown Therapy: Unknown  Prior Psych Hospitalization: Yes  prior Self Harm: Yes Prior Violence: Unknown  Family Psych History: Unknown Family Hx suicide: Unknown  Social History:  Developmental Hx: See chart Educational Hx: Unknown Occupational Hx: Unemployed Legal Hx: Unknown Living Situation: Currently homeless Spiritual Hx: Unknown Access to weapons/lethal means: Denies access to guns  Substance History Alcohol: Drinks daily Type of alcohol Beer Last Drink 02/04/2024 Number of drinks per day half of 1/5 History of alcohol withdrawal seizures unknown History of DT's unknown Tobacco: Yes Illicit drugs: Marijuana, Prescription drug abuse: Unknown Rehab hx: Unknown  Exam Findings  Physical Exam: See chart Vital Signs:  Temp:  [98.8 F (37.1 C)] 98.8 F (37.1 C) (05/18 0856) Pulse Rate:  [74] 74 (05/18 0856)  Resp:  [16] 16 (05/18 0856) BP: (168)/(92) 168/92 (05/18 0856) SpO2:  [99 %] 99 % (05/18 0856) Weight:  [59 kg] 59 kg (05/18 0858) Blood pressure (!) 168/92, pulse 74, temperature 98.8 F (37.1 C), resp. rate 16, height 5\' 6"  (1.676 m), weight 59 kg, SpO2 99%. Body mass index is 20.98 kg/m.  Physical Exam HENT:     Head: Normocephalic.     Nose: Nose normal.  Eyes:     Pupils: Pupils are equal, round, and reactive to light.  Cardiovascular:     Rate and Rhythm: Normal rate.   Pulmonary:     Effort: Pulmonary effort is normal.  Musculoskeletal:        General: Normal range of motion.     Cervical back: Normal range of motion.  Neurological:     General: No focal deficit present.     Mental Status: She is alert.  Psychiatric:        Mood and Affect: Mood normal.        Behavior: Behavior normal.        Thought Content: Thought content normal.        Judgment: Judgment normal.     Mental Status Exam: General Appearance: Casual  Orientation:  Full (Time, Place, and Person)  Memory:  Immediate;   Good  Concentration:  Concentration: Fair and Attention Span: Fair  Recall:  Good  Attention  Fair  Eye Contact:  Good  Speech:  Clear and Coherent  Language:  Fair  Volume:  Normal  Mood: Anxious and depressed  Affect:  Flat  Thought Process:  Coherent  Thought Content:  WDL  Suicidal Thoughts:  Yes.  with intent/plan  Homicidal Thoughts:  No  Judgement:  Poor  Insight:  Lacking  Psychomotor Activity:  Normal  Akathisia:  NA  Fund of Knowledge:  Fair      Assets:  Desire for Improvement Housing Resilience Social Support Vocational/Educational  Cognition:  WNL  ADL's:  Intact  AIMS (if indicated):        Other History   These have been pulled in through the EMR, reviewed, and updated if appropriate.  Family History:  The patient's family history is not on file.  Medical History: History reviewed. No pertinent past medical history.  Surgical History: History reviewed. No pertinent surgical history.   Medications:  No current facility-administered medications for this encounter.  Current Outpatient Medications:    amLODipine (NORVASC) 10 MG tablet, Take by mouth., Disp: , Rfl:    ARIPiprazole (ABILIFY) 10 MG tablet, Take 10 mg by mouth daily., Disp: , Rfl:    atorvastatin (LIPITOR) 20 MG tablet, Take 20 mg by mouth daily., Disp: , Rfl:    clobetasol  cream (TEMOVATE ) 0.05 %, Apply 1 Application topically 2 (two) times daily. (Patient  not taking: Reported on 07/28/2023), Disp: 30 g, Rfl: 0   gabapentin  (NEURONTIN ) 300 MG capsule, TAKE 1 CAPSULE BY MOUTH TWICE DAILY WITH BREAKFAST AND LUNCH AND 2 AT BEDTIME., Disp: 120 capsule, Rfl: 0   hydrOXYzine (VISTARIL) 25 MG capsule, Take 25 mg by mouth 3 (three) times daily as needed., Disp: , Rfl:    lidocaine  (XYLOCAINE ) 5 % ointment, Apply 1 Application topically as needed., Disp: 35.44 g, Rfl: 0   meloxicam (MOBIC) 15 MG tablet, Take 15 mg by mouth daily., Disp: , Rfl:    pantoprazole (PROTONIX) 40 MG tablet, Take 40 mg by mouth daily., Disp: , Rfl:    traMADol (ULTRAM) 50 MG tablet, Take  50 mg by mouth every 6 (six) hours as needed for severe pain. (Patient not taking: Reported on 07/28/2023), Disp: , Rfl:    Vitamin D, Ergocalciferol, (DRISDOL) 1.25 MG (50000 UNIT) CAPS capsule, Take 50,000 Units by mouth once a week., Disp: , Rfl:   Allergies: Allergies  Allergen Reactions   Multihance  [Gadobenate] Itching    MRI contrast   Coconut Fatty Acid Hives    Dorthea Gauze, NP

## 2024-02-05 NOTE — ED Notes (Signed)
 This RN has called [facility] to take the patient to [facility] BMU room 309

## 2024-02-05 NOTE — ED Notes (Signed)
Pt belongings in locker 5 

## 2024-02-05 NOTE — ED Notes (Signed)
 The patient has been transferred to [facility] via safe transport. Belongings sent with transport. The patient signed the form that all of her belongings were present.

## 2024-02-05 NOTE — ED Triage Notes (Addendum)
 Pt came in via POV requesting help with detox from mariajuana, ETOH & cocaine. Stated she last used during the night, A/Ox4. Denies n/v, endorses HA. Endorses SI with no plan.

## 2024-02-05 NOTE — Progress Notes (Addendum)
 [facility] LCSW Progress Note   02/05/2024    6:23 PM  Monique Key   [id number]   Type of Contact and Topic:  Psychiatric Bed Placement   Pt accepted to [facility] BMU     Patient meets inpatient criteria per Dorthea Gauze, NP    The attending provider will be Dr. Margurette Shillings  Call report to [phone]  Nolene Baumgarten, RN @ [facility] ED notified.     Pt scheduled  to arrive at [facility] today at 2000.    Phares Brasher, MSW, LCSW-A  6:24 PM 02/05/2024

## 2024-02-05 NOTE — ED Notes (Signed)
 The daughter, Maaliyah Adolph, called to speak to the patient.  The patient does not have phone access and the daughter states she cannot reach her on her cell phone.  This message was relayed to the patient.

## 2024-02-05 NOTE — ED Provider Notes (Addendum)
 [facility] EM[facility]GENCY DEPARTMENT AT [facility] Provider Note   CSN: [id number] Arrival date & time: 02/05/24  4098     History  Chief Complaint  Patient presents with   Request Detox    SI    Monique Key is a 63 y.o. female.  The history is provided by the patient and medical records. No language interpreter was used.  Mental Health Problem Presenting symptoms: hallucinations and suicidal thoughts   Presenting symptoms: no agitation   Degree of incapacity (severity):  Severe Onset quality:  Gradual Timing:  Constant Progression:  Worsening Chronicity:  Recurrent Context: noncompliance   Treatment compliance:  Untreated Time since last psychoactive medication taken:  2 months Relieved by:  Nothing Worsened by:  Alcohol and drugs Associated symptoms: no abdominal pain, no chest pain, no fatigue and no headaches        Home Medications Prior to Admission medications   Medication Sig Start Date End Date Taking? Authorizing Provider  amLODipine (NORVASC) 10 MG tablet Take by mouth. 01/19/21   [provider]  ARIPiprazole (ABILIFY) 10 MG tablet Take 10 mg by mouth daily. 05/27/23   [provider]  atorvastatin (LIPITOR) 20 MG tablet Take 20 mg by mouth daily. 12/23/21   [provider]  clobetasol  cream (TEMOVATE ) 0.05 % Apply 1 Application topically 2 (two) times daily. Patient not taking: Reported on 07/28/2023 06/29/22   Floyce Hutching, DPM  gabapentin  (NEURONTIN ) 300 MG capsule TAKE 1 CAPSULE BY MOUTH TWICE DAILY WITH BREAKFAST AND LUNCH AND 2 AT BEDTIME. 08/09/22   McDonald, Olive Better, DPM  hydrOXYzine (VISTARIL) 25 MG capsule Take 25 mg by mouth 3 (three) times daily as needed. 12/24/21   [provider]  lidocaine  (XYLOCAINE ) 5 % ointment Apply 1 Application topically as needed. 06/14/22   McDonald, Olive Better, DPM  meloxicam (MOBIC) 15 MG tablet Take 15 mg by mouth daily.    [provider]  pantoprazole (PROTONIX) 40 MG  tablet Take 40 mg by mouth daily. 12/23/21   [provider]  traMADol (ULTRAM) 50 MG tablet Take 50 mg by mouth every 6 (six) hours as needed for severe pain. Patient not taking: Reported on 07/28/2023    [provider]  Vitamin D, Ergocalciferol, (DRISDOL) 1.25 MG (50000 UNIT) CAPS capsule Take 50,000 Units by mouth once a week. 05/14/23   [provider]      Allergies    Multihance  [gadobenate] and Coconut fatty acid    Review of Systems   Review of Systems  Constitutional:  Negative for chills, fatigue and fever.  HENT:  Negative for congestion.   Respiratory:  Negative for cough, chest tightness, shortness of breath and wheezing.   Cardiovascular:  Negative for chest pain and palpitations.  Gastrointestinal:  Negative for abdominal pain, constipation, diarrhea, nausea and vomiting.  Genitourinary:  Negative for dysuria.  Musculoskeletal:  Negative for back pain, neck pain and neck stiffness.  Skin:  Negative for rash and wound.  Neurological:  Negative for light-headedness and headaches.  Psychiatric/Behavioral:  Positive for hallucinations and suicidal ideas. Negative for agitation.   All other systems reviewed and are negative.   Physical Exam Updated Vital Signs BP (!) 168/92   Pulse 74   Temp 98.8 F (37.1 C)   Resp 16   Ht 5\' 6"  (1.676 m)   Wt 59 kg   SpO2 99%   BMI 20.98 kg/m  Physical Exam Vitals and nursing note reviewed.  Constitutional:  General: She is not in acute distress.    Appearance: She is well-developed. She is not ill-appearing, toxic-appearing or diaphoretic.  HENT:     Head: Normocephalic and atraumatic.     Nose: Nose normal.  Eyes:     Extraocular Movements: Extraocular movements intact.     Conjunctiva/sclera: Conjunctivae normal.     Pupils: Pupils are equal, round, and reactive to light.  Cardiovascular:     Rate and Rhythm: Normal rate and regular rhythm.     Heart sounds: No murmur heard. Pulmonary:      Effort: Pulmonary effort is normal. No respiratory distress.     Breath sounds: Normal breath sounds. No wheezing, rhonchi or rales.  Chest:     Chest wall: No tenderness.  Abdominal:     General: Abdomen is flat.     Palpations: Abdomen is soft.     Tenderness: There is no abdominal tenderness.  Musculoskeletal:        General: No swelling or tenderness.     Cervical back: Neck supple.  Skin:    General: Skin is warm and dry.     Capillary Refill: Capillary refill takes less than 2 seconds.     Findings: No erythema or rash.  Neurological:     General: No focal deficit present.     Mental Status: She is alert.  Psychiatric:        Attention and Perception: She perceives auditory hallucinations.        Behavior: Behavior is not agitated.        Thought Content: Thought content includes suicidal ideation. Thought content does not include homicidal ideation. Thought content does not include homicidal or suicidal plan.     ED Results / Procedures / Treatments   Labs (all labs ordered are listed, but only abnormal results are displayed) Labs Reviewed  COMPREHENSIVE METABOLIC PANEL WITH GFR - Abnormal; Notable for the following components:      Result Value   BUN 7 (*)    All other components within normal limits  CBC - Abnormal; Notable for the following components:   RBC 5.18 (*)    Hemoglobin 16.1 (*)    HCT 48.3 (*)    All other components within normal limits  RAPID URINE DRUG SCREEN, HOSP PERFORMED - Abnormal; Notable for the following components:   Cocaine POSITIVE (*)    Tetrahydrocannabinol POSITIVE (*)    All other components within normal limits  ETHANOL    EKG None  Radiology No results found.  Procedures Procedures    Medications Ordered in ED Medications - No data to display  ED Course/ Medical Decision Making/ A&P                                 Medical Decision Making Amount and/or Complexity of Data Reviewed Labs: ordered.    Monique Key is a 63 y.o. female with a past medical history significant for polysubstance abuse, suicide attempt, and self-reported bipolar disorder not on medications for the last few months who presents with suicidal ideation, hallucinations, and withdrawal symptoms with detox.  According to patient, she uses drugs and drinks about a pint of liquor a day at minimum.  She reports that she decided she wants help.  She says that she is having suicidal thoughts and does not a plan to kill herself but has tried to slit her wrist in the past as well as  overdose with medications.  She says that she has been hearing voices and her hallucinations are worsening with her bipolar that she has not been on medicines for for the last several months.  She says she has not had significant withdrawal troubles in the past and denies any history of seizures from alcohol withdrawal.  She says that she is here voluntarily and wants help for both the substance troubles and her hallucinations and suicidal thoughts.  She denies any trauma.  Otherwise physically she is denying any chest pain, shortness of breath, nausea, vomiting, constipation, diarrhea, or urinary changes.  She is resting now.  On exam, lungs clear.  Chest nontender.  Abdomen nontender.  Lungs did not have any rhonchi or rales.  No evidence of acute trauma.  Pupils are symmetric and reactive with normal extraocular movements.  Patient is reporting the SI but denies HI.  She does report the auditory hallucinations.  Patient will get screening clearance labs for mental health evaluation and dissipate TTS consultation.  Given her worsening hallucinations, suicidal thoughts with prior attempt, and her substance abuse troubles, anticipate follow-up on TTS recommendations for patient disposition. Aaron Aas  2:33 PM Patient medical workup reassuring.  She is not anemic and has no leukocytosis.  Her metabolic panel is reassuring with no AKI or LFT elevation.  UDS consistent with  cocaine and cannabis which is what the patient reported using and alcohol is undetectable as she reports she quit drinking last night.  She is felt to be medically clear for psychiatric management given the hallucinations, voices, suicidal thoughts and previous suicide attempt in the setting of reported bipolar history not on medications for several months.  Patient is here voluntarily and we will move her to a psychiatric evaluation status.  She will need a med reconciliation to be completed so that she can have home meds ordered.  She reports has not taken her psychiatric medications for several months so we will defer restarting to the psychiatric team.  Patient resting comfortably.     Final Clinical Impression(s) / ED Diagnoses Final diagnoses:  Suicidal ideation  Polysubstance abuse (HCC)     Clinical Impression: 1. Suicidal ideation   2. Polysubstance abuse (HCC)     Disposition: Awaiting TTS recommendations for further management.  This note was prepared with assistance of Conservation officer, historic buildings. Occasional wrong-word or sound-a-like substitutions may have occurred due to the inherent limitations of voice recognition software.     Kazuto Sevey, Marine Sia, MD 02/05/24 1547    Brinlynn Gorton, Marine Sia, MD 02/05/24 [phone]

## 2024-02-06 ENCOUNTER — Encounter: Payer: Self-pay | Admitting: Psychiatry

## 2024-02-06 ENCOUNTER — Other Ambulatory Visit: Payer: Self-pay

## 2024-02-06 DIAGNOSIS — R45851 Suicidal ideations: Secondary | ICD-10-CM

## 2024-02-06 DIAGNOSIS — F319 Bipolar disorder, unspecified: Secondary | ICD-10-CM | POA: Insufficient documentation

## 2024-02-06 LAB — TSH: TSH: 0.365 u[IU]/mL (ref 0.350–4.500)

## 2024-02-06 LAB — LIPID PANEL
Cholesterol: 192 mg/dL (ref 0–200)
HDL: 51 mg/dL (ref >40)
LDL Cholesterol: 119 mg/dL — ABNORMAL HIGH (ref 0–99)
Total CHOL/HDL Ratio: 3.8 ratio
Triglycerides: 111 mg/dL (ref <150)
VLDL: 22 mg/dL (ref 0–40)

## 2024-02-06 LAB — HEMOGLOBIN A1C
Hgb A1c MFr Bld: 5.4 % (ref 4.8–5.6)
Mean Plasma Glucose: 108.28 mg/dL

## 2024-02-06 MED ORDER — AMLODIPINE BESYLATE 5 MG PO TABS
5.0000 mg | ORAL_TABLET | Freq: Every day | ORAL | Status: DC
  Administered 2024-02-07 – 2024-02-10 (×4): 5 mg via ORAL
  Filled 2024-02-06 (×3): qty 1

## 2024-02-06 MED ORDER — AMLODIPINE BESYLATE 5 MG PO TABS
5.0000 mg | ORAL_TABLET | Freq: Every day | ORAL | Status: DC
  Filled 2024-02-06: qty 1

## 2024-02-06 MED ORDER — SERTRALINE HCL 25 MG PO TABS
50.0000 mg | ORAL_TABLET | Freq: Every day | ORAL | Status: DC
  Administered 2024-02-06 – 2024-02-10 (×5): 50 mg via ORAL
  Filled 2024-02-06 (×5): qty 2

## 2024-02-06 MED ORDER — PANTOPRAZOLE SODIUM 40 MG PO TBEC
40.0000 mg | DELAYED_RELEASE_TABLET | Freq: Every day | ORAL | Status: DC
  Administered 2024-02-06 – 2024-02-10 (×5): 40 mg via ORAL
  Filled 2024-02-06 (×5): qty 1

## 2024-02-06 MED ORDER — ARIPIPRAZOLE 5 MG PO TABS
5.0000 mg | ORAL_TABLET | Freq: Every day | ORAL | Status: DC
Start: 2024-02-06 — End: 2024-02-10
  Administered 2024-02-06 – 2024-02-10 (×5): 5 mg via ORAL
  Filled 2024-02-06 (×5): qty 1

## 2024-02-06 MED ORDER — CHLORDIAZEPOXIDE HCL 25 MG PO CAPS: 25.0000 mg | ORAL_CAPSULE | Freq: Four times a day (QID) | ORAL | Status: DC | PRN

## 2024-02-06 MED ORDER — ATORVASTATIN CALCIUM 20 MG PO TABS
20.0000 mg | ORAL_TABLET | Freq: Every day | ORAL | Status: DC
  Administered 2024-02-07 – 2024-02-10 (×4): 20 mg via ORAL
  Filled 2024-02-06 (×4): qty 1

## 2024-02-06 NOTE — Group Note (Signed)
 Date:  02/06/2024 Time:  10:08 AM  Group Topic/Focus:  Goals Group:   The focus of this group is to help patients establish daily goals to achieve during treatment and discuss how the patient can incorporate goal setting into their daily lives to aide in recovery.    Participation Level:  Did Not Attend   Mellie Sprinkle [facility] 02/06/2024, 10:08 AM

## 2024-02-06 NOTE — Plan of Care (Signed)
  Problem: Education: Goal: Knowledge of [facility] Education information/materials will improve Outcome: Progressing   Problem: Education: Goal: Emotional status will improve Outcome: Progressing   

## 2024-02-06 NOTE — [facility] Counselor (Signed)
 CSW spoke with daughter, Amarea Macdowell [phone]). She shared that her mother came into the hospital because she was in a lot of pain due to stomach cancer. Pereida also shared that her mother has issues with addiction as well. She denied feeling that her mother was a danger to herself or anyone else, however, she also acknowledged that she does not know this for certain. She denied her mother having access to any weapons. Parr shared that her mother does sometimes seems like she is giving up hope because of her health issues which contribute to her mental health issues. No other concerns expressed. Contact ended without incident.   Elvie Hammed. Johnnye Nancy, MSW, LCSW, LCAS 02/06/2024 3:24 PM

## 2024-02-06 NOTE — [facility] Suicide Risk Assessment (Signed)
 [facility] Admission Suicide Risk Assessment   Nursing information obtained from:  Patient Demographic factors:  Age 63 or older, Low socioeconomic status, Unemployed Current Mental Status:  NA Loss Factors:  Decline in physical health, Financial problems / change in socioeconomic status Historical Factors:  Impulsivity Risk Reduction Factors:  Positive coping skills or problem solving skills  Total Time spent with patient: 45 minutes Principal Problem: Suicidal ideation Diagnosis:  Principal Problem:   Suicidal ideation Active Problems:   Alcohol use disorder, severe, dependence (HCC)   Cocaine use disorder, severe, dependence (HCC)   Auditory hallucination   Bipolar disorder (HCC)  Subjective Data: Monique Key is a 63 y.o. female admitted: Presented to the EDfor 02/05/2024  8:52 AM for SI and alcohol abuse. She carries the psychiatric diagnoses of bipolar dx, ETOH, MDD, polysubstance abuse patient presented due to SI and requesting detox.  On presentation she endorsed SI with a plan to overdose.  She reports primary stressors recently becoming homeless.  She reports increased substance use past 2 weeks.  At time presentation she endorsed auditory hallucinations stating, " I am going to get you, die bitch die." Patient reports she has not taken her medicine in about a week, patient reports that she used marijuana and cocaine the last time she used Cocaine was last night patient reports she drinks half of 1/5 of alcohol on a daily basis.   Continued Clinical Symptoms:  Alcohol Use Disorder Identification Test Final Score (AUDIT): 27 The "Alcohol Use Disorders Identification Test", Guidelines for Use in Primary Care, Second Edition.  World Science writer [facility]). Score between 0-7:  no or low risk or alcohol related problems. Score between 8-15:  moderate risk of alcohol related problems. Score between 16-19:  high risk of alcohol related problems. Score 20 or above:  warrants further  diagnostic evaluation for alcohol dependence and treatment.   CLINICAL FACTORS:   Bipolar Disorder:   Depressive phase Alcohol/Substance Abuse/Dependencies   Musculoskeletal: Strength & Muscle Tone: within normal limits Gait & Station: normal Patient leans: N/A  Psychiatric Specialty Exam:  Presentation  General Appearance:  Casual  Eye Contact: Fair  Speech: Clear and Coherent  Speech Volume: Normal  Handedness: Right   Mood and Affect  Mood: Anxious; Depressed  Affect: Flat; Labile   Thought Process  Thought Processes: Linear  Descriptions of Associations:Intact  Orientation:Full (Time, Place and Person)  Thought Content:WDL  History of Schizophrenia/Schizoaffective disorder:No data recorded Duration of Psychotic Symptoms:No data recorded Hallucinations:Hallucinations: Auditory Description of Auditory Hallucinations: " I'm going to get you,  die bitch die"  Ideas of Reference:Percusatory  Suicidal Thoughts:Suicidal Thoughts: Yes, Active SI Active Intent and/or Plan: With Intent; With Plan  Homicidal Thoughts:Homicidal Thoughts: No   Sensorium  Memory: Immediate Fair  Judgment: Poor  Insight: Lacking   Executive Functions  Concentration: Fair  Attention Span: Fair  Recall: [employer] of Knowledge: Fair  Language: Fair   Psychomotor Activity  Psychomotor Activity: Psychomotor Activity: Normal   Assets  Assets: Desire for Improvement; Resilience; Housing; Vocational/Educational; Social Support   Sleep  Sleep: Sleep: Fair Number of Hours of Sleep: 6    Physical Exam: Physical Exam Constitutional:      Appearance: She is normal weight.  HENT:     Head: Atraumatic.  Eyes:     Extraocular Movements: Extraocular movements intact.  Pulmonary:     Effort: Pulmonary effort is normal.  Neurological:     Mental Status: She is alert.  Psychiatric:  Behavior: Behavior normal.    Review of Systems   Psychiatric/Behavioral:  Positive for depression and suicidal ideas. Negative for hallucinations. The patient is nervous/anxious. The patient does not have insomnia.    Blood pressure (!) 157/106, pulse 79, temperature (!) 97.3 F (36.3 C), resp. rate 20, height 5\' 6"  (1.676 m), weight 58.1 kg, SpO2 99%. Body mass index is 20.66 kg/m.   COGNITIVE FEATURES THAT CONTRIBUTE TO RISK:  Closed-mindedness    SUICIDE RISK:   Mild:  Suicidal ideation of limited frequency, intensity, duration, and specificity.  There are no identifiable plans, no associated intent, mild dysphoria and related symptoms, good self-control (both objective and subjective assessment), few other risk factors, and identifiable protective factors, including available and accessible social support.  PLAN OF CARE:  crisis stabilization, therapeutic milieu, encourage group attendance and participation, medication management for mood stabilization and development of comprehensive mental wellness/sobriety plan    I certify that inpatient services furnished can reasonably be expected to improve the patient's condition.   Fay Hoop, PA-C 02/06/2024, 2:31 PM

## 2024-02-06 NOTE — Progress Notes (Signed)
   02/06/24 1000  Psych Admission Type (Psych Patients Only)  Admission Status Voluntary  Psychosocial Assessment  Patient Complaints Sleep disturbance  Eye Contact Brief  Facial Expression Sad  Affect Sad  Speech Soft;Slow  Interaction Assertive  Motor Activity Slow  Appearance/Hygiene Unremarkable  Behavior Characteristics Cooperative  Mood Depressed;Sad  Thought Process  Coherency WDL  Content WDL  Delusions None reported or observed  Perception WDL  Hallucination None reported or observed  Judgment Impaired  Confusion WDL  Danger to Self  Current suicidal ideation? Denies  Danger to Others  Danger to Others None reported or observed   Patient had Vistaril  x 1 for anxiety with good result. Patient denies any withdrawal symptoms other than anxiety. Support and encouragement given.

## 2024-02-06 NOTE — [facility] Suicide Risk Assessment (Signed)
 [facility] INPATIENT:  Family/Significant Other Suicide Prevention Education  Suicide Prevention Education:  Contact Attempts: Jullie Oiler Idris/daughter [phone]), has been identified by the patient as the family member/significant other with whom the patient will be residing, and identified as the person(s) who will aid the patient in the event of a mental health crisis.  With written consent from the patient, two attempts were made to provide suicide prevention education, prior to and/or following the patient's discharge.  We were unsuccessful in providing suicide prevention education.  A suicide education pamphlet was given to the patient to share with family/significant other.  Date and time of first attempt: 02/06/24 at 3:05 PM Date and time of second attempt: Second attempt needed.  CSW was unable to establish contact but HIPAA compliant voicemail left with contact information for follow through.   Randolm Butte 02/06/2024, 3:06 PM

## 2024-02-06 NOTE — Progress Notes (Signed)
 Admission Note:  62 yr female who presents voluntary commitment in no acute distress for the treatment of SI and Depression. Patient appears flat and depressed, her thoughts are organized , speech is soft, affect is congruent with mood, she was tearful during assessment. Patient stated she was experiencing worsening depression and increased use of substance abuse. Patient was calm and cooperative with admission process, she denies SI/HI/AVH and contracts for safety upon admission. Patient expressed that she is currently homeless and seeks help for  sobriety for substance abuse. Patient has a history of substance abuse. Patient's skin was assessed and found to be clear of any abnormal marks apart and she was searched and no contraband found, plan of care and unit policies explained, understanding verbalized and consent obtained. Food and fluids offered, and patient was receptive and accepted. 15 minutes safety checks maintained will continue to monitor.
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
## 2024-02-06 NOTE — Group Note (Deleted)
 LCSW Group Therapy Note   Group Date: 02/06/2024 Start Time: 1300 End Time: 1400   Type of Therapy and Topic:  Group Therapy: Challenging Core Beliefs  Participation Level:  {[facility] PARTICIPATION ZOXWR:60454}  Description of Group:  Patients were educated about core beliefs and asked to identify one harmful core belief that they have. Patients were asked to explore from where those beliefs originate. Patients were asked to discuss how those beliefs make them feel and the resulting behaviors of those beliefs. They were then be asked if those beliefs are true and, if so, what evidence they have to support them. Lastly, group members were challenged to replace those negative core beliefs with helpful beliefs.   Therapeutic Goals:   1. Patient will identify harmful core beliefs and explore the origins of such beliefs. 2. Patient will identify feelings and behaviors that result from those core beliefs. 3. Patient will discuss whether such beliefs are true. 4.  Patient will replace harmful core beliefs with helpful ones.  Summary of Patient Progress:  *** actively engaged in processing and exploring how core beliefs are formed and how they impact thoughts, feelings, and behaviors. Patient proved open to input from peers and feedback from CSW. Patient demonstrated *** insight into the subject matter, was respectful and supportive of peers, and participated throughout the entire session.  Therapeutic Modalities: Cognitive Behavioral Therapy; Solution-Focused Therapy   Herley Bernardini M Shiesha Jahn, Milinda Allen 02/06/2024  2:11 PM

## 2024-02-06 NOTE — BH IP Treatment Plan (Signed)
 Interdisciplinary Treatment and Diagnostic Plan Update  02/06/2024 Time of Session: 10:10 Monique Key MRN: [id number]  Principal Diagnosis: Auditory hallucination  Secondary Diagnoses: Principal Problem:   Auditory hallucination   Current Medications:  Current Facility-Administered Medications  Medication Dose Route Frequency Provider Last Rate Last Admin   acetaminophen  (TYLENOL ) tablet 650 mg  650 mg Oral Q6H PRN Dorthea Gauze, NP       alum & mag hydroxide-simeth (MAALOX/MYLANTA) 200-200-20 MG/5ML suspension 30 mL  30 mL Oral Q4H PRN Dorthea Gauze, NP       dicyclomine  (BENTYL ) tablet 20 mg  20 mg Oral Q6H PRN Dorthea Gauze, NP       hydrOXYzine  (ATARAX ) tablet 25 mg  25 mg Oral Q6H PRN Dorthea Gauze, NP       loperamide  (IMODIUM ) capsule 2-4 mg  2-4 mg Oral PRN Dorthea Gauze, NP       magnesium  hydroxide (MILK OF MAGNESIA) suspension 30 mL  30 mL Oral Daily PRN Dorthea Gauze, NP       methocarbamol  (ROBAXIN ) tablet 500 mg  500 mg Oral Q8H PRN Dorthea Gauze, NP       naproxen  (NAPROSYN ) tablet 500 mg  500 mg Oral BID PRN Dorthea Gauze, NP       OLANZapine  (ZYPREXA ) injection 10 mg  10 mg Intramuscular TID PRN Dorthea Gauze, NP       OLANZapine  (ZYPREXA ) injection 5 mg  5 mg Intramuscular TID PRN Dorthea Gauze, NP       OLANZapine  zydis (ZYPREXA ) disintegrating tablet 5 mg  5 mg Oral TID PRN Dorthea Gauze, NP       ondansetron  (ZOFRAN -ODT) disintegrating tablet 4 mg  4 mg Oral Q6H PRN Dorthea Gauze, NP       PTA Medications: Medications Prior to Admission  Medication Sig Dispense Refill Last Dose/Taking   amLODipine  (NORVASC ) 10 MG tablet Take by mouth.      ARIPiprazole  (ABILIFY ) 10 MG tablet Take 10 mg by mouth daily.      atorvastatin  (LIPITOR) 20 MG tablet Take 20 mg by mouth daily.      clobetasol  cream (TEMOVATE ) 0.05 % Apply 1 Application topically 2 (two) times daily. (Patient not taking: Reported on 07/28/2023) 30 g 0    gabapentin  (NEURONTIN ) 300 MG capsule TAKE 1  CAPSULE BY MOUTH TWICE DAILY WITH BREAKFAST AND LUNCH AND 2 AT BEDTIME. 120 capsule 0    hydrOXYzine  (VISTARIL ) 25 MG capsule Take 25 mg by mouth 3 (three) times daily as needed.      lidocaine  (XYLOCAINE ) 5 % ointment Apply 1 Application topically as needed. 35.44 g 0    meloxicam (MOBIC) 15 MG tablet Take 15 mg by mouth daily.      pantoprazole  (PROTONIX ) 40 MG tablet Take 40 mg by mouth daily.      traMADol (ULTRAM) 50 MG tablet Take 50 mg by mouth every 6 (six) hours as needed for severe pain. (Patient not taking: Reported on 07/28/2023)      Vitamin D, Ergocalciferol, (DRISDOL) 1.25 MG (50000 UNIT) CAPS capsule Take 50,000 Units by mouth once a week.       Patient Stressors: Health problems   Substance abuse    Patient Strengths: Ability for Contractor for treatment/growth   Treatment Modalities: Medication Management, Group therapy, Case management,  1 to 1 session with clinician, Psychoeducation, Recreational therapy.   Physician Treatment Plan for Primary Diagnosis: Auditory hallucination Long Term Goal(s):     Short Term Goals:  Medication Management: Evaluate patient's response, side effects, and tolerance of medication regimen.  Therapeutic Interventions: 1 to 1 sessions, Unit Group sessions and Medication administration.  Evaluation of Outcomes: Not Met  Physician Treatment Plan for Secondary Diagnosis: Principal Problem:   Auditory hallucination  Long Term Goal(s):     Short Term Goals:       Medication Management: Evaluate patient's response, side effects, and tolerance of medication regimen.  Therapeutic Interventions: 1 to 1 sessions, Unit Group sessions and Medication administration.  Evaluation of Outcomes: Not Met   RN Treatment Plan for Primary Diagnosis: Auditory hallucination Long Term Goal(s): Knowledge of disease and therapeutic regimen to maintain health will improve  Short Term Goals: Ability to remain free from  injury will improve, Ability to verbalize frustration and anger appropriately will improve, Ability to demonstrate self-control, Ability to participate in decision making will improve, Ability to verbalize feelings will improve, Ability to disclose and discuss suicidal ideas, Ability to identify and develop effective coping behaviors will improve, and Compliance with prescribed medications will improve  Medication Management: RN will administer medications as ordered by provider, will assess and evaluate patient's response and provide education to patient for prescribed medication. RN will report any adverse and/or side effects to prescribing provider.  Therapeutic Interventions: 1 on 1 counseling sessions, Psychoeducation, Medication administration, Evaluate responses to treatment, Monitor vital signs and CBGs as ordered, Perform/monitor CIWA, COWS, AIMS and Fall Risk screenings as ordered, Perform wound care treatments as ordered.  Evaluation of Outcomes: Not Met   LCSW Treatment Plan for Primary Diagnosis: Auditory hallucination Long Term Goal(s): Safe transition to appropriate next level of care at discharge, Engage patient in therapeutic group addressing interpersonal concerns.  Short Term Goals: Engage patient in aftercare planning with referrals and resources, Increase social support, Increase ability to appropriately verbalize feelings, Increase emotional regulation, Facilitate acceptance of mental health diagnosis and concerns, Facilitate patient progression through stages of change regarding substance use diagnoses and concerns, Identify triggers associated with mental health/substance abuse issues, and Increase skills for wellness and recovery  Therapeutic Interventions: Assess for all discharge needs, 1 to 1 time with Social worker, Explore available resources and support systems, Assess for adequacy in community support network, Educate family and significant other(s) on suicide  prevention, Complete Psychosocial Assessment, Interpersonal group therapy.  Evaluation of Outcomes: Not Met   Progress in Treatment: Attending groups: Yes. Participating in groups: Yes. Taking medication as prescribed: Yes. Toleration medication: Yes. Family/Significant other contact made: No, will contact:  when given permission.  Patient understands diagnosis: Yes. Discussing patient identified problems/goals with staff: Yes. Medical problems stabilized or resolved: Yes. Denies suicidal/homicidal ideation: Yes. Issues/concerns per patient self-inventory: No. Other: none.   New problem(s) identified: No, Describe:  none identified.   New Short Term/Long Term Goal(s): detox, elimination of symptoms of psychosis, medication management for mood stabilization; elimination of SI thoughts; development of comprehensive mental wellness/sobriety plan.  Patient Goals:  "I don't want to be homeless. Something like emergency housing."  Discharge Plan or Barriers: CSW will assist pt with development of an appropriate aftercare/discharge plan.   Reason for Continuation of Hospitalization: Depression Hallucinations Medication stabilization Suicidal ideation  Estimated Length of Stay: 1-7 days  Last 3 [place] Suicide Severity Risk Score: Flowsheet Row Admission (Current) from 02/05/2024 in [facility] Most recent reading at 02/06/2024  3:18 AM ED from 02/05/2024 in [facility] Emergency Department at [facility] Most recent reading at 02/05/2024  8:59 AM ED from 10/08/2022 in  [place] [facility] at [facility] Most recent reading at 10/08/2022  7:39 PM  C-SSRS RISK CATEGORY Moderate Risk Moderate Risk No Risk       Last PHQ 2/9 Scores:     No data to display          Scribe for Treatment Team: Randolm Butte, LCSW 02/06/2024 10:41 AM

## 2024-02-06 NOTE — Group Note (Signed)
 [facility] LCSW Group Therapy Note   Group Date: 02/06/2024 Start Time: 1300 End Time: 1400   Type of Therapy/Topic:  Group Therapy:  Emotion Regulation  Participation Level:  Did Not Attend   Mood:  Description of Group:    The purpose of this group is to assist patients in learning to regulate negative emotions and experience positive emotions. Patients will be guided to discuss ways in which they have been vulnerable to their negative emotions. These vulnerabilities will be juxtaposed with experiences of positive emotions or situations, and patients challenged to use positive emotions to combat negative ones. Special emphasis will be placed on coping with negative emotions in conflict situations, and patients will process healthy conflict resolution skills.  Therapeutic Goals: Patient will identify two positive emotions or experiences to reflect on in order to balance out negative emotions:  Patient will label two or more emotions that they find the most difficult to experience:  Patient will be able to demonstrate positive conflict resolution skills through discussion or role plays:   Summary of Patient Progress:   The patient did not attend group.     Therapeutic Modalities:   Cognitive Behavioral Therapy Feelings Identification Dialectical Behavioral Therapy   Roselle Conner, LCSW
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
## 2024-02-06 NOTE — [facility] Suicide Risk Assessment (Signed)
 [facility] INPATIENT:  Family/Significant Other Suicide Prevention Education  Suicide Prevention Education:  Education Completed; Mining engineer [phone]), has been identified by the patient as the family member/significant other with whom the patient will be residing, and identified as the person(s) who will aid the patient in the event of a mental health crisis (suicidal ideations/suicide attempt).  With written consent from the patient, the family member/significant other has been provided the following suicide prevention education, prior to the and/or following the discharge of the patient.  The suicide prevention education provided includes the following: Suicide risk factors Suicide prevention and interventions National Suicide Hotline telephone number [facility] assessment telephone number [facility] Emergency Assistance 911 [facility] and/or Residential Mobile Crisis Unit telephone number  Request made of family/significant other to: Remove weapons (e.g., guns, rifles, knives), all items previously/currently identified as safety concern.   Remove drugs/medications (over-the-counter, prescriptions, illicit drugs), all items previously/currently identified as a safety concern.  The family member/significant other verbalizes understanding of the suicide prevention education information provided.  The family member/significant other agrees to remove the items of safety concern listed above.  Monique Key 02/06/2024, 3:21 PM

## 2024-02-06 NOTE — Group Note (Signed)
 Recreation Therapy Group Note   Group Topic:Coping Skills  Group Date: 02/06/2024 Start Time: 1030 End Time: 1120 Facilitators: Deatrice Factor, LRT, CTRS Location: Craft Room  Group Description: Mind Map.  Patient was provided a blank template of a diagram with 32 blank boxes in a tiered system, branching from the center (similar to a bubble chart). LRT directed patients to label the middle of the diagram "Coping Skills". LRT and patients then came up with 8 different coping skills as examples. Pt were directed to record their coping skills in the 2nd tier boxes closest to the center.  Patients would then share their coping skills with the group as LRT wrote them out. LRT gave a handout of 99 different coping skills at the end of group.   Goal Area(s) Addressed: Patients will be able to define "coping skills". Patient will identify new coping skills.  Patient will increase communication.   Affect/Mood: Appropriate and Flat   Participation Level: Moderate   Participation Quality: Independent   Behavior: Calm and Cooperative   Speech/Thought Process: Coherent   Insight: Fair   Judgement: Good   Modes of Intervention: Clarification, Education, Worksheet, and Writing   Patient Response to Interventions:  Receptive   Education Outcome:  In group clarification offered    Clinical Observations/Individualized Feedback: Monique Key was mostly active in their participation of session activities and group discussion. Pt identified "arts and crafts, music, and dance" as coping skills. Pt minimally interacted with LRT and peers while present in group.    Plan: Continue to engage patient in RT group sessions 2-3x/week.   Deatrice Factor, LRT, CTRS 02/06/2024 12:58 PM

## 2024-02-06 NOTE — Tx Team (Signed)
 Initial Treatment Plan 02/06/2024 3:42 AM Monique Key [id number]    PATIENT STRESSORS: Health problems   Substance abuse     PATIENT STRENGTHS: Ability for insight  Communication skills  Motivation for treatment/growth    PATIENT IDENTIFIED PROBLEMS: Substance Abuse     Suicidal ideation                 DISCHARGE CRITERIA:  Adequate post-discharge living arrangements Improved stabilization in mood, thinking, and/or behavior  PRELIMINARY DISCHARGE PLAN: Outpatient therapy Placement in alternative living arrangements  PATIENT/FAMILY INVOLVEMENT: This treatment plan has been presented to and reviewed with the patient, Monique Key,  The patient and family have been given the opportunity to ask questions and make suggestions.  Monique Coker Abisola Vinette Crites, RN 02/06/2024, 3:42 AM

## 2024-02-06 NOTE — Group Note (Signed)
 Recreation Therapy Group Note   Group Topic:Health and Wellness  Group Date: 02/06/2024 Start Time: 1515 End Time: 1615 Facilitators: Deatrice Factor, LRT, CTRS Location: Courtyard  Group Description: [employer]. LRT and patients played games of basketball, drew with chalk, and played corn hole while outside in the courtyard while getting fresh air and sunlight. Music was being played in the background. LRT and peers conversed about different games they have played before, what they do in their free time and anything else that is on their minds. LRT encouraged pts to drink water after being outside, sweating and getting their heart rate up.  Goal Area(s) Addressed: Patient will build on frustration tolerance skills. Patients will partake in a competitive play game with peers. Patients will gain knowledge of new leisure interest/hobby.    Affect/Mood: N/A   Participation Level: Did not attend    Clinical Observations/Individualized Feedback: Patient did not attend group.   Plan: Continue to engage patient in RT group sessions 2-3x/week.   Deatrice Factor, LRT, CTRS 02/06/2024 5:07 PM

## 2024-02-06 NOTE — Plan of Care (Signed)
  Problem: Education: Goal: Mental status will improve Outcome: Progressing   Problem: Activity: Goal: Interest or engagement in activities will improve Outcome: Progressing   Problem: Physical Regulation: Goal: Ability to maintain clinical measurements within normal limits will improve Outcome: Progressing   Problem: Safety: Goal: Periods of time without injury will increase Outcome: Progressing

## 2024-02-07 NOTE — Group Note (Signed)
 Recreation Therapy Group Note   Group Topic:Other  Group Date: 02/07/2024 Start Time: 0945 End Time: 1055 Facilitators: Deatrice Factor, LRT, CTRS Location: Courtyard  Group Description: [employer]. LRT and patients played games of basketball, drew with chalk, and played corn hole while outside in the courtyard while getting fresh air and sunlight. Music was being played in the background. LRT and peers conversed about different games they have played before, what they do in their free time and anything else that is on their minds. LRT encouraged pts to drink water after being outside, sweating and getting their heart rate up.  Goal Area(s) Addressed: Patient will build on frustration tolerance skills. Patients will partake in a competitive play game with peers. Patients will gain knowledge of new leisure interest/hobby.    Affect/Mood: N/A   Participation Level: Did not attend    Clinical Observations/Individualized Feedback: Patient did not attend group.   Plan: Continue to engage patient in RT group sessions 2-3x/week.   Deatrice Factor, LRT, CTRS 02/07/2024 11:06 AM

## 2024-02-07 NOTE — Group Note (Signed)
 Recreation Therapy Group Note   Group Topic:Health and Wellness  Group Date: 02/07/2024 Start Time: 1450 End Time: 1535 Facilitators: Deatrice Factor, LRT, CTRS Location: Craft Room  Activity Description/Intervention: Therapeutic Drumming. Patients with peers and staff were given the opportunity to engage in a leader facilitated [facility] Group Empowerment Drumming Circle with staff from the [employer], in partnership with [employer]. Teaching laboratory technician and trained [employer], Kathlyne Parchment leading with LRT observing and documenting intervention and pt response. This evidenced-based practice targets 7 areas of health and wellbeing in the human experience including: stress-reduction, exercise, self-expression, camaraderie/support, nurturing, spirituality, and music-making (leisure).    Goal Area(s) Addresses:  Patient will engage in pro-social way in music group.  Patient will follow directions of drum leader on the first prompt. Patient will demonstrate no behavioral issues during group.  Patient will identify if a reduction in stress level occurs as a result of participation in therapeutic drum circle.     Education: Leisure exposure, Pharmacologist, Musical expression, Discharge Planning   Affect/Mood: N/A   Participation Level: Did not attend    Clinical Observations/Individualized Feedback: Patient did not attend group.   Plan: Continue to engage patient in RT group sessions 2-3x/week.   [id number], LRT, CTRS 02/07/2024 5:10 PM

## 2024-02-07 NOTE — Progress Notes (Signed)
 Patient sleeping. Will administer morning medications when patient awakens.

## 2024-02-07 NOTE — Group Note (Signed)
 Date:  02/07/2024 Time:  9:58 PM  Group Topic/Focus:  Wrap-Up Group:   The focus of this group is to help patients review their daily goal of treatment and discuss progress on daily workbooks.    Participation Level:  Did Not Attend    Monique Key 02/07/2024, 9:58 PM

## 2024-02-07 NOTE — Progress Notes (Signed)
   02/06/24 1944  Psych Admission Type (Psych Patients Only)  Admission Status Voluntary  Psychosocial Assessment  Patient Complaints Sleep disturbance  Eye Contact Brief  Facial Expression Sad  Affect Sad  Speech Soft  Interaction Assertive  Motor Activity Slow  Appearance/Hygiene Unremarkable  Behavior Characteristics Cooperative  Mood Depressed;Sad  Aggressive Behavior  Effect No apparent injury  Thought Process  Coherency WDL  Content WDL  Delusions None reported or observed  Perception WDL  Hallucination None reported or observed  Judgment WDL  Danger to Self  Current suicidal ideation? Denies  Danger to Others  Danger to Others None reported or observed

## 2024-02-07 NOTE — Plan of Care (Signed)
   Problem: Education: Goal: Knowledge of [place] Education information/materials will improve Outcome: Progressing Goal: Emotional status will improve Outcome: Progressing

## 2024-02-07 NOTE — Plan of Care (Signed)

## 2024-02-07 NOTE — Progress Notes (Signed)
   02/07/24 1159  Psych Admission Type (Psych Patients Only)  Admission Status Voluntary  Psychosocial Assessment  Patient Complaints Anxiety  Eye Contact Brief  Facial Expression Flat  Affect Flat  Speech Logical/coherent  Interaction Minimal  Motor Activity Slow  Appearance/Hygiene Unremarkable  Behavior Characteristics Cooperative  Mood Pleasant;Anxious  Thought Process  Coherency WDL  Content WDL  Delusions None reported or observed  Perception WDL  Hallucination None reported or observed  Judgment WDL  Confusion None  Danger to Self  Current suicidal ideation? Denies  Danger to Others  Danger to Others None reported or observed

## 2024-02-07 NOTE — Group Note (Signed)
 [facility] LCSW Group Therapy Note   Group Date: 02/07/2024 Start Time: 1310 End Time: 1400  Type of Therapy/Topic:  Group Therapy:  Feelings about Diagnosis  Participation Level:  Did Not Attend   Description of Group:    This group will allow patients to explore their thoughts and feelings about diagnoses they have received. Patients will be guided to explore their level of understanding and acceptance of these diagnoses. Facilitator will encourage patients to process their thoughts and feelings about the reactions of others to their diagnosis, and will guide patients in identifying ways to discuss their diagnosis with significant others in their lives. This group will be process-oriented, with patients participating in exploration of their own experiences as well as giving and receiving support and challenge from other group members.   Therapeutic Goals: 1. Patient will demonstrate understanding of diagnosis as evidence by identifying two or more symptoms of the disorder:  2. Patient will be able to express two feelings regarding the diagnosis 3. Patient will demonstrate ability to communicate their needs through discussion and/or role plays  Summary of Patient Progress: Patient declined to attend group.  Therapeutic Modalities:   Cognitive Behavioral Therapy Brief Therapy Feelings Identification    Larri Ply, LCSW

## 2024-02-07 NOTE — Progress Notes (Signed)
 [facility] MD Progress Note  02/07/2024 1:57 PM Monique Key  MRN:  [id number]   Subjective:  Chart reviewed, case discussed in multidisciplinary meeting, patient seen during rounds.  Patient seen today for follow-up awake easily.  Pleasant and cooperative on exam.  There are linear logical and future oriented on exam.  They report they had more SI yesterday but none today.  They denied ongoing hallucinations.  They report they are tolerating medications well.  They deny current symptoms of withdrawal.  They did not require medication overnight for elevated CUS.  They did require Atarax  for anxiety.  They deny adverse effects of medication.  They report they are tolerating Abilify  and sertraline  well.  They demonstrate poor insight into the need for outpatient follow-up and medication compliance.  Discussed discharge planning and the need for safe discharge disposition follow-up for medication management.  Discussed patient attending residential treatment program for SA IOP program the patient is hesitant.   Sleep: Fair  Appetite:  Fair  Past Psychiatric History: see h&P Family History: History reviewed. No pertinent family history. Social History:  Social History   Substance and Sexual Activity  Alcohol Use Yes     Social History   Substance and Sexual Activity  Drug Use Not on file    Social History   Socioeconomic History   Marital status: Single    Spouse name: Not on file   Number of children: Not on file   Years of education: Not on file   Highest education level: Not on file  Occupational History   Not on file  Tobacco Use   Smoking status: Every Day    Current packs/day: 0.50    Average packs/day: 0.5 packs/day for 20.0 years (10.0 ttl pk-yrs)    Types: Cigarettes   Smokeless tobacco: Never  Vaping Use   Vaping status: Never Used  Substance and Sexual Activity   Alcohol use: Yes   Drug use: Not on file   Sexual activity: Not on file  Other Topics Concern   Not on  file  Social History Narrative   Not on file   Social Drivers of Health   Financial Resource Strain: Not on file  Food Insecurity: Food Insecurity Present (02/06/2024)   Hunger Vital Sign    Worried About Running Out of Food in the Last Year: Sometimes true    Ran Out of Food in the Last Year: Sometimes true  Transportation Needs: Unmet Transportation Needs (02/06/2024)   PRAPARE - Administrator, Civil Service (Medical): Yes    Lack of Transportation (Non-Medical): Yes  Physical Activity: Not on file  Stress: Not on file  Social Connections: Socially Isolated (02/06/2024)   Social Connection and Isolation Panel [NHANES]    Frequency of Communication with Friends and Family: Once a week    Frequency of Social Gatherings with Friends and Family: Once a week    Attends Religious Services: 1 to 4 times per year    Active Member of [employer] or Organizations: No    Attends Banker Meetings: Never    Marital Status: Never married   Past Medical History: History reviewed. No pertinent past medical history. History reviewed. No pertinent surgical history.  Current Medications: Current Facility-Administered Medications  Medication Dose Route Frequency Provider Last Rate Last Admin   acetaminophen  (TYLENOL ) tablet 650 mg  650 mg Oral Q6H PRN Dorthea Gauze, NP   650 mg at 02/07/24 1146   alum & mag hydroxide-simeth (MAALOX/MYLANTA) 200-200-20 MG/5ML suspension  30 mL  30 mL Oral Q4H PRN Dorthea Gauze, NP       amLODipine  (NORVASC ) tablet 5 mg  5 mg Oral Daily Brinae Woods E, PA-C   5 mg at 02/07/24 1145   ARIPiprazole  (ABILIFY ) tablet 5 mg  5 mg Oral Daily Samentha Perham E, PA-C   5 mg at 02/07/24 1145   atorvastatin  (LIPITOR) tablet 20 mg  20 mg Oral Daily Zuha Dejonge E, PA-C   20 mg at 02/07/24 1145   chlordiazePOXIDE  (LIBRIUM ) capsule 25 mg  25 mg Oral Q6H PRN Rhea Thrun E, PA-C       dicyclomine  (BENTYL ) tablet 20 mg  20 mg Oral Q6H PRN  Dorthea Gauze, NP       hydrOXYzine  (ATARAX ) tablet 25 mg  25 mg Oral Q6H PRN Dorthea Gauze, NP   25 mg at 02/07/24 1147   loperamide  (IMODIUM ) capsule 2-4 mg  2-4 mg Oral PRN Dorthea Gauze, NP       magnesium  hydroxide (MILK OF MAGNESIA) suspension 30 mL  30 mL Oral Daily PRN Dorthea Gauze, NP   30 mL at 02/07/24 1146   methocarbamol  (ROBAXIN ) tablet 500 mg  500 mg Oral Q8H PRN Dorthea Gauze, NP       naproxen  (NAPROSYN ) tablet 500 mg  500 mg Oral BID PRN Dorthea Gauze, NP       OLANZapine  (ZYPREXA ) injection 10 mg  10 mg Intramuscular TID PRN Dorthea Gauze, NP       OLANZapine  (ZYPREXA ) injection 5 mg  5 mg Intramuscular TID PRN Dorthea Gauze, NP       OLANZapine  zydis (ZYPREXA ) disintegrating tablet 5 mg  5 mg Oral TID PRN Dorthea Gauze, NP       ondansetron  (ZOFRAN -ODT) disintegrating tablet 4 mg  4 mg Oral Q6H PRN Dorthea Gauze, NP       pantoprazole  (PROTONIX ) EC tablet 40 mg  40 mg Oral Daily Darlynn Ricco E, PA-C   40 mg at 02/07/24 1145   sertraline  (ZOLOFT ) tablet 50 mg  50 mg Oral Daily Abbie Berling E, PA-C   50 mg at 02/07/24 1146    Lab Results:  Results for orders placed or performed during the hospital encounter of 02/05/24 (from the past 48 hours)  Lipid panel     Status: Abnormal   Collection Time: 02/06/24  8:39 AM  Result Value Ref Range   Cholesterol 192 0 - 200 mg/dL   Triglycerides 161 <096 mg/dL   HDL 51 >04 mg/dL   Total CHOL/HDL Ratio 3.8 RATIO   VLDL 22 0 - 40 mg/dL   LDL Cholesterol 540 (H) 0 - 99 mg/dL    Comment:        Total Cholesterol/HDL:CHD Risk Coronary Heart Disease Risk Table                     Men   Women  1/2 Average Risk   3.4   3.3  Average Risk       5.0   4.4  2 X Average Risk   9.6   7.1  3 X Average Risk  23.4   11.0        Use the calculated Patient Ratio above and the CHD Risk Table to determine the patient's CHD Risk.        ATP III CLASSIFICATION (LDL):  <100     mg/dL   Optimal  981-191  mg/dL   Near or Above  Optimal  130-159  mg/dL   Borderline  161-096  mg/dL   High  >045     mg/dL   Very High Performed at [facility], [address] [place], [place] [address]   Hemoglobin A1c     Status: None   Collection Time: 02/06/24  8:39 AM  Result Value Ref Range   Hgb A1c MFr Bld 5.4 4.8 - 5.6 %    Comment: (NOTE) Pre diabetes:          5.7%-6.4%  Diabetes:              >6.4%  Glycemic control for   <7.0% adults with diabetes    Mean Plasma Glucose 108.28 mg/dL    Comment: Performed at [facility], [address], [place], [place] [address]  TSH     Status: None   Collection Time: 02/06/24  8:39 AM  Result Value Ref Range   TSH 0.365 0.350 - 4.500 uIU/mL    Comment: Performed by a 3rd Generation assay with a functional sensitivity of <=0.01 uIU/mL. Performed at [facility], [address] [place], [place] [address]     Blood Alcohol level:  Lab Results  Component Value Date   [facility] <15 02/05/2024    Metabolic Disorder Labs: Lab Results  Component Value Date   HGBA1C 5.4 02/06/2024   MPG 108.28 02/06/2024   Lab Results  Component Value Date   PROLACTIN 7.2 01/14/2010   Lab Results  Component Value Date   CHOL 192 02/06/2024   TRIG 111 02/06/2024   HDL 51 02/06/2024   CHOLHDL 3.8 02/06/2024   VLDL 22 02/06/2024   LDLCALC 119 (H) 02/06/2024    Physical Findings: AIMS:  , ,  ,  ,    CIWA:  CIWA-Ar Total: 3 COWS:  COWS Total Score: 1   Psychiatric Specialty Exam:  Presentation  General Appearance:  Casual  Eye Contact: Fair  Speech: Clear and Coherent  Speech Volume: Normal    Mood and Affect  Mood: Depressed  Affect: Congruent   Thought Process  Thought Processes: Linear  Descriptions of Associations:Intact  Orientation:Full (Time, Place and Person)  Thought Content:WDL  Hallucinations:Hallucinations: None  Ideas of Reference:None  Suicidal Thoughts:Suicidal Thoughts: No  Homicidal  Thoughts:Homicidal Thoughts: No   Sensorium  Memory: Immediate Fair  Judgment: Poor  Insight: Poor   Executive Functions  Concentration: Fair  Attention Span: Fair  Recall: [employer] of Knowledge: Fair  Language: Fair   Psychomotor Activity  Psychomotor Activity: Psychomotor Activity: Normal  Musculoskeletal: Strength & Muscle Tone: within normal limits Gait & Station: normal Assets  Assets: Manufacturing systems engineer; Desire for Improvement    Physical Exam: Physical Exam HENT:     Head: Atraumatic.  Eyes:     Extraocular Movements: Extraocular movements intact.  Pulmonary:     Effort: Pulmonary effort is normal.  Neurological:     Mental Status: She is alert.  Psychiatric:        Behavior: Behavior normal.    Review of Systems  Psychiatric/Behavioral:  Positive for depression and substance abuse. Negative for hallucinations. The patient is nervous/anxious.    Blood pressure (!) 156/106, pulse 84, temperature 98 F (36.7 C), resp. rate 16, height 5\' 6"  (1.676 m), weight 58.1 kg, SpO2 98%. Body mass index is 20.66 kg/m.  Diagnosis: Principal Problem:   Suicidal ideation Active Problems:   Alcohol use disorder, severe, dependence (HCC)   Cocaine use disorder, severe, dependence (HCC)  Auditory hallucination   Bipolar disorder (HCC)   PLAN: Safety and Monitoring:  -- Voluntary admission to inpatient psychiatric unit for safety, stabilization and treatment  -- Daily contact with patient to assess and evaluate symptoms and progress in treatment  -- Patient's case to be discussed in multi-disciplinary team meeting  -- Observation Level : q15 minute checks  -- Vital signs:  q12 hours  -- Precautions: suicide, elopement, and assault -- Encouraged patient to participate in unit milieu and in scheduled group therapies  2. Psychiatric Diagnoses and Treatment:   Bipolar disorder currently depressed: Depression of the context of recent homelessness.   Patient has previously tolerated Abilify  and is agreeable to restarting this medication.  Chart review finds 10 mg with most recent dose will initiate at 5 mg.  Will also restart patient on Zoloft  50 mg.  She continues to note SI and requires inpatient psychiatric hospitalization for medication management and stabilization due to risk of self injury.   -Continue Abilify  5 mg daily will titrate to 10 mg tomorrow  - Continue Zoloft  50 mg daily               Polysubstance abuse.  Patient denies all symptoms of withdrawal, hypertension is present however patient notes she has that at baseline.  Will encourage patient to attend treatment discussed gabapentin  we will hold at this time.  Auditory hallucinations are likely substance induced patient does not continue to report.    - Continue CIWA protocol   -- The risks/benefits/side-effects/alternatives to this medication were discussed in detail with the patient and time was given for questions. The patient consents to medication trial.                -- Metabolic profile and EKG monitoring obtained while on an atypical antipsychotic (BMI: Lipid Panel: HbgA1c: QTc:)              -- Encouraged patient to participate in unit milieu and in scheduled group therapies                            3. Medical Issues Being Addressed:    Hypertension restarted amlodipine   1 to 2 weeks ago will hold until tomorrow given recent cocaine use. Hyperlipidemia we will restart atorvastatin .  Reports was last taken approximately 1 to 2 weeks ago GERD will restart pantoprazole .  Reports was last taken approximately 1 to 2 weeks ago      4. Discharge Planning:   -- Social work and case management to assist with discharge planning and identification of hospital follow-up needs prior to discharge  -- Estimated LOS: 3-4 days  Fay Hoop, PA-C 02/07/2024, 1:57 PM

## 2024-02-07 NOTE — Group Note (Deleted)
 Date:  02/07/2024 Time:  9:45 PM  Group Topic/Focus:  Wrap-Up Group:   The focus of this group is to help patients review their daily goal of treatment and discuss progress on daily workbooks.     Participation Level:  {BHH PARTICIPATION EAVWU:98119}  Participation Quality:  {BHH PARTICIPATION QUALITY:22265}  Affect:  {BHH AFFECT:22266}  Cognitive:  {BHH COGNITIVE:22267}  Insight: {BHH Insight2:20797}  Engagement in Group:  {BHH ENGAGEMENT IN JYNWG:95621}  Modes of Intervention:  {BHH MODES OF INTERVENTION:22269}  Additional Comments:  ***  Maglione,Preciosa Bundrick E 02/07/2024, 9:45 PM

## 2024-02-07 NOTE — Group Note (Signed)
 Date:  02/07/2024 Time:  11:11 AM  Group Topic/Focus:  Goals Group:   The focus of this group is to help patients establish daily goals to achieve during treatment and discuss how the patient can incorporate goal setting into their daily lives to aide in recovery.    Participation Level:  Did Not Attend   Mellie Sprinkle [facility] 02/07/2024, 11:11 AM

## 2024-02-07 NOTE — Group Note (Signed)
 Date:  02/07/2024 Time:  6:27 PM  Group Topic/Focus:  Wellness Toolbox:   The focus of this group is to discuss various aspects of wellness, balancing those aspects and exploring ways to increase the ability to experience wellness.  Patients will create a wellness toolbox for use upon discharge.    Participation Level:  Active  Participation Quality:  Appropriate  Affect:  Appropriate  Cognitive:  Appropriate  Insight: Appropriate  Engagement in Group:  Engaged  Modes of Intervention:  Activity and Socialization  Additional Comments:    Laverne Potter 02/07/2024, 6:27 PM

## 2024-02-08 ENCOUNTER — Encounter: Payer: Self-pay | Admitting: Psychiatry

## 2024-02-08 MED ORDER — NICOTINE 14 MG/24HR TD PT24
14.0000 mg | MEDICATED_PATCH | Freq: Every day | TRANSDERMAL | Status: DC
  Administered 2024-02-08 – 2024-02-10 (×3): 14 mg via TRANSDERMAL
  Filled 2024-02-08 (×3): qty 1

## 2024-02-08 NOTE — Group Note (Signed)
 Date:  02/08/2024 Time:  9:55 PM  Group Topic/Focus:  Coping With Mental Health Crisis:   The purpose of this group is to help patients identify strategies for coping with mental health crisis.  Group discusses possible causes of crisis and ways to manage them effectively.    Participation Level:  Active  Participation Quality:  Appropriate and Supportive  Affect:  Appropriate  Cognitive:  Appropriate and Oriented  Insight: Appropriate  Engagement in Group:  Engaged and Supportive  Modes of Intervention:  Discussion, Education, and Support  Additional Comments:  n/a  Laquentin Loudermilk L 02/08/2024, 9:55 PM

## 2024-02-08 NOTE — Progress Notes (Signed)
 [facility] MD Progress Note  02/08/2024 3:28 PM Monique Key  MRN:  [id number]   Subjective:  Chart reviewed, case discussed in multidisciplinary meeting, patient seen during rounds.  Patient seen today for follow-up awake easily.  Pleasant and cooperative on exam. They report they are doing well this morning. Denied ongoing SI. They are Linear, logical, and future oriented with a desire to attend residential treatment. They continue to tolerate medications well. She required PRN atarax  for anxiety overnight. She rated depression at 6/10 and anxiety at 5/10. Reported finances and substance use as stressors. Pt noted they were lightheaded on waking this morning but were able to tolerate oral intake and ambulate independently. Lightheadedness has since resolved. Denies HI/AVH. Voices no further concerns or complaints at this time.    Sleep: Fair  Appetite:  Fair  Past Psychiatric History: see h&P Family History: History reviewed. No pertinent family history. Social History:  Social History   Substance and Sexual Activity  Alcohol Use Yes     Social History   Substance and Sexual Activity  Drug Use Not on file    Social History   Socioeconomic History   Marital status: Single    Spouse name: Not on file   Number of children: Not on file   Years of education: Not on file   Highest education level: Not on file  Occupational History   Not on file  Tobacco Use   Smoking status: Every Day    Current packs/day: 0.50    Average packs/day: 0.5 packs/day for 20.0 years (10.0 ttl pk-yrs)    Types: Cigarettes   Smokeless tobacco: Never  Vaping Use   Vaping status: Never Used  Substance and Sexual Activity   Alcohol use: Yes   Drug use: Not on file   Sexual activity: Not on file  Other Topics Concern   Not on file  Social History Narrative   Not on file   Social Drivers of Health   Financial Resource Strain: Not on file  Food Insecurity: Food Insecurity Present (02/06/2024)    Hunger Vital Sign    Worried About Running Out of Food in the Last Year: Sometimes true    Ran Out of Food in the Last Year: Sometimes true  Transportation Needs: Unmet Transportation Needs (02/06/2024)   PRAPARE - Administrator, Civil Service (Medical): Yes    Lack of Transportation (Non-Medical): Yes  Physical Activity: Not on file  Stress: Not on file  Social Connections: Socially Isolated (02/06/2024)   Social Connection and Isolation Panel [NHANES]    Frequency of Communication with Friends and Family: Once a week    Frequency of Social Gatherings with Friends and Family: Once a week    Attends Religious Services: 1 to 4 times per year    Active Member of [employer] or Organizations: No    Attends Banker Meetings: Never    Marital Status: Never married   Past Medical History: History reviewed. No pertinent past medical history. History reviewed. No pertinent surgical history.  Current Medications: Current Facility-Administered Medications  Medication Dose Route Frequency Provider Last Rate Last Admin   acetaminophen  (TYLENOL ) tablet 650 mg  650 mg Oral Q6H PRN Dorthea Gauze, NP   650 mg at 02/07/24 2127   alum & mag hydroxide-simeth (MAALOX/MYLANTA) 200-200-20 MG/5ML suspension 30 mL  30 mL Oral Q4H PRN Dorthea Gauze, NP       amLODipine  (NORVASC ) tablet 5 mg  5 mg Oral Daily Sagan Maselli E,  PA-C   5 mg at 02/08/24 1610   ARIPiprazole  (ABILIFY ) tablet 5 mg  5 mg Oral Daily Clariece Roesler E, PA-C   5 mg at 02/08/24 0819   atorvastatin  (LIPITOR) tablet 20 mg  20 mg Oral Daily Louann Hopson E, PA-C   20 mg at 02/08/24 9604   chlordiazePOXIDE  (LIBRIUM ) capsule 25 mg  25 mg Oral Q6H PRN Edahi Kroening E, PA-C       dicyclomine  (BENTYL ) tablet 20 mg  20 mg Oral Q6H PRN Dorthea Gauze, NP       hydrOXYzine  (ATARAX ) tablet 25 mg  25 mg Oral Q6H PRN Dorthea Gauze, NP   25 mg at 02/07/24 1147   loperamide  (IMODIUM ) capsule 2-4 mg  2-4 mg Oral PRN  Dorthea Gauze, NP       magnesium  hydroxide (MILK OF MAGNESIA) suspension 30 mL  30 mL Oral Daily PRN Dorthea Gauze, NP   30 mL at 02/08/24 0816   methocarbamol  (ROBAXIN ) tablet 500 mg  500 mg Oral Q8H PRN Dorthea Gauze, NP       naproxen  (NAPROSYN ) tablet 500 mg  500 mg Oral BID PRN Dorthea Gauze, NP       nicotine (NICODERM CQ - dosed in mg/24 hours) patch 14 mg  14 mg Transdermal Daily Jadapalle, Sree, MD   14 mg at 02/08/24 5409   OLANZapine  (ZYPREXA ) injection 10 mg  10 mg Intramuscular TID PRN Dorthea Gauze, NP       OLANZapine  (ZYPREXA ) injection 5 mg  5 mg Intramuscular TID PRN Dorthea Gauze, NP       OLANZapine  zydis (ZYPREXA ) disintegrating tablet 5 mg  5 mg Oral TID PRN Dorthea Gauze, NP       ondansetron  (ZOFRAN -ODT) disintegrating tablet 4 mg  4 mg Oral Q6H PRN Dorthea Gauze, NP       pantoprazole  (PROTONIX ) EC tablet 40 mg  40 mg Oral Daily Raeley Gilmore E, PA-C   40 mg at 02/08/24 8119   sertraline  (ZOLOFT ) tablet 50 mg  50 mg Oral Daily Seldon Barrell E, PA-C   50 mg at 02/08/24 1478    Lab Results:  No results found for this or any previous visit (from the past 48 hours).   Blood Alcohol level:  Lab Results  Component Value Date   [facility] <15 02/05/2024    Metabolic Disorder Labs: Lab Results  Component Value Date   HGBA1C 5.4 02/06/2024   MPG 108.28 02/06/2024   Lab Results  Component Value Date   PROLACTIN 7.2 01/14/2010   Lab Results  Component Value Date   CHOL 192 02/06/2024   TRIG 111 02/06/2024   HDL 51 02/06/2024   CHOLHDL 3.8 02/06/2024   VLDL 22 02/06/2024   LDLCALC 119 (H) 02/06/2024    Physical Findings: AIMS:  , ,  ,  ,    CIWA:  CIWA-Ar Total: 0 COWS:  COWS Total Score: 0   Psychiatric Specialty Exam:  Presentation  General Appearance:  Casual  Eye Contact: Fair  Speech: Clear and Coherent  Speech Volume: Normal    Mood and Affect  Mood: Euthymic  Affect: Appropriate   Thought Process  Thought  Processes: Coherent; Linear; Goal Directed  Descriptions of Associations:Intact  Orientation:Full (Time, Place and Person)  Thought Content:Logical  Hallucinations:Hallucinations: None  Ideas of Reference:None  Suicidal Thoughts:Suicidal Thoughts: No  Homicidal Thoughts:Homicidal Thoughts: No   Sensorium  Memory: Immediate Fair  Judgment: Fair  Insight: Fair   Art therapist  Concentration: Fair  Attention Span: Fair  Recall: [employer] of Knowledge: Fair  Language: Fair   Psychomotor Activity  Psychomotor Activity: Psychomotor Activity: Normal  Musculoskeletal: Strength & Muscle Tone: within normal limits Gait & Station: normal Assets  Assets: Manufacturing systems engineer; Desire for Improvement; Resilience    Physical Exam: Physical Exam HENT:     Head: Atraumatic.  Eyes:     Extraocular Movements: Extraocular movements intact.  Pulmonary:     Effort: Pulmonary effort is normal.  Skin:    General: Skin is warm and dry.  Neurological:     Mental Status: She is alert.  Psychiatric:        Behavior: Behavior normal.    Review of Systems  Eyes:  Negative for blurred vision.  Cardiovascular:  Negative for chest pain and palpitations.  Psychiatric/Behavioral:  Positive for depression and substance abuse. Negative for hallucinations and suicidal ideas. The patient is nervous/anxious. The patient does not have insomnia.    Blood pressure (!) 170/85, pulse 71, temperature 97.7 F (36.5 C), resp. rate 18, height 5\' 6"  (1.676 m), weight 58.1 kg, SpO2 100%. Body mass index is 20.66 kg/m.  Diagnosis: Principal Problem:   Suicidal ideation Active Problems:   Alcohol use disorder, severe, dependence (HCC)   Cocaine use disorder, severe, dependence (HCC)   Auditory hallucination   Bipolar disorder (HCC)   PLAN: Safety and Monitoring:  -- Voluntary admission to inpatient psychiatric unit for safety, stabilization and treatment  -- Daily  contact with patient to assess and evaluate symptoms and progress in treatment  -- Patient's case to be discussed in multi-disciplinary team meeting  -- Observation Level : q15 minute checks  -- Vital signs:  q12 hours  -- Precautions: suicide, elopement, and assault -- Encouraged patient to participate in unit milieu and in scheduled group therapies  2. Psychiatric Diagnoses and Treatment:   Bipolar disorder currently depressed:     -Continue Abilify  5 mg daily plan to titrate tomorrow if she continues to tolerate.   - Continue Zoloft  50 mg daily               Polysubstance abuse.   Has not required medication for elevated CIWA score.  Continue to encourage to seek residential treatment.    - Continue CIWA protocol   -- The risks/benefits/side-effects/alternatives to this medication were discussed in detail with the patient and time was given for questions. The patient consents to medication trial.                -- Metabolic profile and EKG monitoring obtained while on an atypical antipsychotic (BMI: Lipid Panel: HbgA1c: QTc:)              -- Encouraged patient to participate in unit milieu and in scheduled group therapies                            3. Medical Issues Being Addressed:    Hypertension restarted amlodipine   1 to 2 weeks ago will hold until tomorrow given recent cocaine use. Hyperlipidemia we will restart atorvastatin .  Reports was last taken approximately 1 to 2 weeks ago GERD will restart pantoprazole .  Reports was last taken approximately 1 to 2 weeks ago      4. Discharge Planning:   -- Social work and case management to assist with discharge planning and identification of hospital follow-up needs prior to discharge  -- Estimated LOS: 3-4 days  Fay Hoop, PA-C  02/08/2024, 3:28 PM

## 2024-02-08 NOTE — Progress Notes (Signed)
   02/08/24 0900  Psych Admission Type (Psych Patients Only)  Admission Status Voluntary  Psychosocial Assessment  Patient Complaints None  Eye Contact Fair  Facial Expression Flat  Affect Flat  Speech Logical/coherent  Interaction Minimal  Motor Activity Slow  Appearance/Hygiene Unremarkable  Behavior Characteristics Cooperative  Mood Pleasant  Thought Process  Coherency WDL  Content WDL  Delusions None reported or observed  Perception WDL  Hallucination None reported or observed  Judgment WDL  Confusion None  Danger to Self  Current suicidal ideation? Denies  Danger to Others  Danger to Others None reported or observed  Danger to Others Abnormal  Harmful Behavior to others No threats or harm toward other people  Destructive Behavior No threats or harm toward property

## 2024-02-08 NOTE — Plan of Care (Signed)

## 2024-02-08 NOTE — Plan of Care (Signed)
  Problem: Education: Goal: Emotional status will improve Outcome: Progressing Goal: Mental status will improve Outcome: Progressing   Problem: Coping: Goal: Ability to verbalize frustrations and anger appropriately will improve Outcome: Progressing   Problem: Safety: Goal: Periods of time without injury will increase Outcome: Progressing

## 2024-02-08 NOTE — Group Note (Signed)
 Date:  02/08/2024 Time:  5:34 PM  Group Topic/Focus:  Wellness Toolbox:   The focus of this group is to discuss various aspects of wellness, balancing those aspects and exploring ways to increase the ability to experience wellness.  Patients will create a wellness toolbox for use upon discharge.    Participation Level:  Active  Participation Quality:  Appropriate  Affect:  Appropriate  Cognitive:  Appropriate  Insight: Appropriate  Engagement in Group:  Engaged  Modes of Intervention:  Activity and Socialization  Additional Comments:    Laverne Potter 02/08/2024, 5:34 PM

## 2024-02-08 NOTE — Group Note (Signed)
 [facility] LCSW Group Therapy Note   Group Date: 02/08/2024 Start Time: 1300 End Time: 1335   Type of Therapy/Topic:  Group Therapy:  Emotion Regulation  Participation Level:  Minimal    Description of Group:    The purpose of this group is to assist patients in learning to regulate negative emotions and experience positive emotions. Patients will be guided to discuss ways in which they have been vulnerable to their negative emotions. These vulnerabilities will be juxtaposed with experiences of positive emotions or situations, and patients challenged to use positive emotions to combat negative ones. Special emphasis will be placed on coping with negative emotions in conflict situations, and patients will process healthy conflict resolution skills.  Therapeutic Goals: Patient will identify two positive emotions or experiences to reflect on in order to balance out negative emotions:  Patient will label two or more emotions that they find the most difficult to experience:  Patient will be able to demonstrate positive conflict resolution skills through discussion or role plays:   Summary of Patient Progress: Patient was present for part of the group process. During her time in the room she did not speak very much but appeared to attend to the conversation as she could be seen nodding her head as her peers spoke. She did share that changing your friend group is important in recovery and finding new ways of coping with life stressors. Pt appeared to have some insight into herself and the topic. She appeared open and receptive to feedback from both her peers and facilitator.   Therapeutic Modalities:   Cognitive Behavioral Therapy Feelings Identification Dialectical Behavioral Therapy   Randolm Butte, LCSW

## 2024-02-08 NOTE — Group Note (Signed)
 Date:  02/08/2024 Time:  10:21 AM  Group Topic/Focus:  Emotional Education:   The focus of this group is to discuss what feelings/emotions are, and how they are experienced.    Participation Level:  Did Not Attend   Monique Key Bobby Barton 02/08/2024, 10:21 AM

## 2024-02-08 NOTE — Progress Notes (Signed)
   02/07/24 2000  Psych Admission Type (Psych Patients Only)  Admission Status Voluntary  Psychosocial Assessment  Patient Complaints Anxiety  Eye Contact Brief  Facial Expression Flat  Affect Flat  Speech Logical/coherent  Interaction Minimal  Motor Activity Slow  Appearance/Hygiene Unremarkable  Behavior Characteristics Appropriate to situation;Cooperative  Mood Pleasant;Anxious  Aggressive Behavior  Effect No apparent injury  Thought Process  Coherency WDL  Content WDL  Delusions None reported or observed  Perception WDL  Hallucination None reported or observed  Judgment WDL  Confusion None  Danger to Self  Current suicidal ideation? Denies  Danger to Others  Danger to Others Reported or observed

## 2024-02-08 NOTE — [facility] Counselor (Signed)
 CSW met with pt briefly to discuss disposition. She shared that she is interested in a 14-day program and then probably further treatment. CSW inquired if pt had been able to look over the list of residential treatment facilities. She denied stating that the information is too small for her to read with or without her glasses. CSW informed her that he could let her borrow a magnifying sheet. She agreed. CSW inquired if she wanted to look in a particular area. She shared that she wanted to stay around the [facility] area. CSW informed her of the [facility] and that he would go ahead and send her information to them. She agreed. No other concerns expressed. Contact ended without incident.   CSW faxed over referral for [facility].   Monique Key. Johnnye Nancy, MSW, LCSW, LCAS 02/08/2024 3:13 PM

## 2024-02-09 NOTE — Group Note (Signed)
 Recreation Therapy Group Note   Group Topic:General Recreation  Group Date: 02/09/2024 Start Time: 1510 End Time: 1610 Facilitators: Deatrice Factor, LRT, CTRS Location: Courtyard  Group Description: [employer]. LRT and patients played games of basketball, drew with chalk, and played corn hole while outside in the courtyard while getting fresh air and sunlight. Music was being played in the background. LRT and peers conversed about different games they have played before, what they do in their free time and anything else that is on their minds. LRT encouraged pts to drink water after being outside, sweating and getting their heart rate up.  Goal Area(s) Addressed: Patient will build on frustration tolerance skills. Patients will partake in a competitive play game with peers. Patients will gain knowledge of new leisure interest/hobby.    Affect/Mood: N/A   Participation Level: Did not attend    Clinical Observations/Individualized Feedback: Patient did not attend group.   Plan: Continue to engage patient in RT group sessions 2-3x/week.   [address] LRT, CTRS 02/09/2024 5:29 PM

## 2024-02-09 NOTE — Progress Notes (Signed)
   02/08/24 2039  Psych Admission Type (Psych Patients Only)  Admission Status Voluntary  Psychosocial Assessment  Patient Complaints None  Eye Contact Brief  Facial Expression Flat  Affect Flat  Speech Logical/coherent  Interaction Minimal  Motor Activity Slow  Appearance/Hygiene Unremarkable  Behavior Characteristics Cooperative;Appropriate to situation  Mood Pleasant  Aggressive Behavior  Effect No apparent injury  Thought Process  Coherency WDL  Content WDL  Delusions None reported or observed  Perception WDL  Hallucination None reported or observed  Judgment WDL  Confusion None  Danger to Self  Current suicidal ideation? Denies  Danger to Others  Danger to Others Reported or observed

## 2024-02-09 NOTE — Progress Notes (Signed)
   02/09/24 1000  Psych Admission Type (Psych Patients Only)  Admission Status Voluntary  Psychosocial Assessment  Patient Complaints Other (Comment) (Patient states " I feel good but I am anxious about the houseing.")  Eye Contact Fair  Facial Expression Other (Comment) (WNL)  Affect Appropriate to circumstance  Speech Logical/coherent  Interaction Assertive  Motor Activity Slow  Appearance/Hygiene Unremarkable  Behavior Characteristics Cooperative;Appropriate to situation  Mood Pleasant  Aggressive Behavior  Effect No apparent injury  Thought Process  Coherency WDL  Content WDL  Delusions None reported or observed  Perception WDL  Hallucination None reported or observed  Judgment Impaired  Confusion None  Danger to Self  Current suicidal ideation? Denies  Danger to Others  Danger to Others None reported or observed  Danger to Others Abnormal  Harmful Behavior to others No threats or harm toward other people  Destructive Behavior No threats or harm toward property   Patient had Vistaril  x 2 for anxiety with fair result. Support and encouragement given.

## 2024-02-09 NOTE — [facility] Counselor (Signed)
 CSW spoke with Moira Andrews at [facility] who reports that the patient's referral is still being reviewed.   Shasta Deist, MSW, LCSW 02/09/2024 1:47 PM

## 2024-02-09 NOTE — Progress Notes (Signed)
 [facility] MD Progress Note  02/09/2024 1:51 PM Monique Key  MRN:  [id number]   Subjective:  Chart reviewed, case discussed in multidisciplinary meeting, patient seen during rounds.  Patient seen today for follow-up they are awake and oriented.  They are pleasant cooperative on exam.  Indicates he would prefer to go to a shelter and then proceed to treatment after sorting their housing situation out.  They note depression continues to improve rate 4 out of 10-day rate anxiety at 5 out of 10.  They deny SI, HI, and AVH.  Last report of SI and ending capacity was on 02/06/24.  That they did not require Librium  for elevated CIWA scores.  They have intermittently used Atarax  for anxiety.  They voiced no concerns or complaints.  Discussed discharge planning.  Sleep: Fair  Appetite:  Fair  Past Psychiatric History: see h&P Family History: History reviewed. No pertinent family history. Social History:  Social History   Substance and Sexual Activity  Alcohol Use Yes     Social History   Substance and Sexual Activity  Drug Use Not on file    Social History   Socioeconomic History   Marital status: Single    Spouse name: Not on file   Number of children: Not on file   Years of education: Not on file   Highest education level: Not on file  Occupational History   Not on file  Tobacco Use   Smoking status: Every Day    Current packs/day: 0.50    Average packs/day: 0.5 packs/day for 20.0 years (10.0 ttl pk-yrs)    Types: Cigarettes   Smokeless tobacco: Never  Vaping Use   Vaping status: Never Used  Substance and Sexual Activity   Alcohol use: Yes   Drug use: Not on file   Sexual activity: Not on file  Other Topics Concern   Not on file  Social History Narrative   Not on file   Social Drivers of Health   Financial Resource Strain: Not on file  Food Insecurity: Food Insecurity Present (02/06/2024)   Hunger Vital Sign    Worried About Running Out of Food in the Last Year: Sometimes  true    Ran Out of Food in the Last Year: Sometimes true  Transportation Needs: Unmet Transportation Needs (02/06/2024)   PRAPARE - Administrator, Civil Service (Medical): Yes    Lack of Transportation (Non-Medical): Yes  Physical Activity: Not on file  Stress: Not on file  Social Connections: Socially Isolated (02/06/2024)   Social Connection and Isolation Panel [NHANES]    Frequency of Communication with Friends and Family: Once a week    Frequency of Social Gatherings with Friends and Family: Once a week    Attends Religious Services: 1 to 4 times per year    Active Member of [employer] or Organizations: No    Attends Banker Meetings: Never    Marital Status: Never married   Past Medical History: History reviewed. No pertinent past medical history. History reviewed. No pertinent surgical history.  Current Medications: Current Facility-Administered Medications  Medication Dose Route Frequency Provider Last Rate Last Admin   acetaminophen  (TYLENOL ) tablet 650 mg  650 mg Oral Q6H PRN Dorthea Gauze, NP   650 mg at 02/08/24 2124   alum & mag hydroxide-simeth (MAALOX/MYLANTA) 200-200-20 MG/5ML suspension 30 mL  30 mL Oral Q4H PRN Dorthea Gauze, NP       amLODipine  (NORVASC ) tablet 5 mg  5 mg Oral Daily Alayssa Flinchum,  Therisa Flatten, PA-C   5 mg at 02/09/24 4098   ARIPiprazole  (ABILIFY ) tablet 5 mg  5 mg Oral Daily Latesa Fratto E, PA-C   5 mg at 02/09/24 0802   atorvastatin  (LIPITOR) tablet 20 mg  20 mg Oral Daily Molley Houser E, PA-C   20 mg at 02/09/24 1191   chlordiazePOXIDE  (LIBRIUM ) capsule 25 mg  25 mg Oral Q6H PRN Yitzel Shasteen E, PA-C       dicyclomine  (BENTYL ) tablet 20 mg  20 mg Oral Q6H PRN Dorthea Gauze, NP       hydrOXYzine  (ATARAX ) tablet 25 mg  25 mg Oral Q6H PRN Dorthea Gauze, NP   25 mg at 02/09/24 4782   loperamide  (IMODIUM ) capsule 2-4 mg  2-4 mg Oral PRN Dorthea Gauze, NP       magnesium  hydroxide (MILK OF MAGNESIA) suspension 30 mL  30 mL  Oral Daily PRN Dorthea Gauze, NP   30 mL at 02/08/24 0816   methocarbamol  (ROBAXIN ) tablet 500 mg  500 mg Oral Q8H PRN Dorthea Gauze, NP       naproxen  (NAPROSYN ) tablet 500 mg  500 mg Oral BID PRN Dorthea Gauze, NP       nicotine (NICODERM CQ - dosed in mg/24 hours) patch 14 mg  14 mg Transdermal Daily Jadapalle, Sree, MD   14 mg at 02/09/24 9562   OLANZapine  (ZYPREXA ) injection 10 mg  10 mg Intramuscular TID PRN Dorthea Gauze, NP       OLANZapine  (ZYPREXA ) injection 5 mg  5 mg Intramuscular TID PRN Dorthea Gauze, NP       OLANZapine  zydis (ZYPREXA ) disintegrating tablet 5 mg  5 mg Oral TID PRN Dorthea Gauze, NP       ondansetron  (ZOFRAN -ODT) disintegrating tablet 4 mg  4 mg Oral Q6H PRN Dorthea Gauze, NP       pantoprazole  (PROTONIX ) EC tablet 40 mg  40 mg Oral Daily Karlea Mckibbin E, PA-C   40 mg at 02/09/24 0802   sertraline  (ZOLOFT ) tablet 50 mg  50 mg Oral Daily Estela Vinal E, PA-C   50 mg at 02/09/24 1308    Lab Results:  No results found for this or any previous visit (from the past 48 hours).   Blood Alcohol level:  Lab Results  Component Value Date   [facility] <15 02/05/2024    Metabolic Disorder Labs: Lab Results  Component Value Date   HGBA1C 5.4 02/06/2024   MPG 108.28 02/06/2024   Lab Results  Component Value Date   PROLACTIN 7.2 01/14/2010   Lab Results  Component Value Date   CHOL 192 02/06/2024   TRIG 111 02/06/2024   HDL 51 02/06/2024   CHOLHDL 3.8 02/06/2024   VLDL 22 02/06/2024   LDLCALC 119 (H) 02/06/2024    Physical Findings: AIMS:  , ,  ,  ,    CIWA:  CIWA-Ar Total: 0 COWS:  COWS Total Score: 0   Psychiatric Specialty Exam:  Presentation  General Appearance:  Casual  Eye Contact: Fair  Speech: Clear and Coherent  Speech Volume: Normal    Mood and Affect  Mood: Euthymic  Affect: Congruent   Thought Process  Thought Processes: Coherent  Descriptions of Associations:Intact  Orientation:Full (Time, Place and  Person)  Thought Content:Logical  Hallucinations:Hallucinations: None  Ideas of Reference:None  Suicidal Thoughts:Suicidal Thoughts: No  Homicidal Thoughts:Homicidal Thoughts: No   Sensorium  Memory: Immediate Fair; Recent Fair  Judgment: Fair  Insight: Fair   Art therapist  Concentration: Fair  Attention Span: Fair  Recall: [employer] of Knowledge: Fair  Language: Fair   Psychomotor Activity  Psychomotor Activity: Psychomotor Activity: Normal  Musculoskeletal: Strength & Muscle Tone: within normal limits Gait & Station: normal Assets  Assets: Manufacturing systems engineer; Desire for Improvement    Physical Exam: Physical Exam Vitals and nursing note reviewed.  HENT:     Head: Atraumatic.  Eyes:     Extraocular Movements: Extraocular movements intact.  Pulmonary:     Effort: Pulmonary effort is normal.  Neurological:     Mental Status: She is alert and oriented to person, place, and time.    Review of Systems  Eyes:  Negative for blurred vision.  Cardiovascular:  Negative for chest pain and palpitations.  Psychiatric/Behavioral:  Positive for substance abuse. Negative for depression, hallucinations and suicidal ideas. The patient is not nervous/anxious and does not have insomnia.    Blood pressure (!) 151/93, pulse 75, temperature 98.4 F (36.9 C), resp. rate 17, height 5\' 6"  (1.676 m), weight 58.1 kg, SpO2 98%. Body mass index is 20.66 kg/m.  Diagnosis: Principal Problem:   Suicidal ideation Active Problems:   Alcohol use disorder, severe, dependence (HCC)   Cocaine use disorder, severe, dependence (HCC)   Auditory hallucination   Bipolar disorder (HCC)   PLAN: Safety and Monitoring:  -- Voluntary admission to inpatient psychiatric unit for safety, stabilization and treatment  -- Daily contact with patient to assess and evaluate symptoms and progress in treatment  -- Patient's case to be discussed in multi-disciplinary team  meeting  -- Observation Level : q15 minute checks  -- Vital signs:  q12 hours  -- Precautions: suicide, elopement, and assault -- Encouraged patient to participate in unit milieu and in scheduled group therapies  2. Psychiatric Diagnoses and Treatment:   Bipolar disorder currently depressed:     - Continue Abilify  5 mg daily    - Continue Zoloft  50 mg daily               Polysubstance abuse.   Has not required medication for elevated CIWA score.  Continue to encourage to seek residential treatment.    - Continue CIWA protocol   -- The risks/benefits/side-effects/alternatives to this medication were discussed in detail with the patient and time was given for questions. The patient consents to medication trial.                -- Metabolic profile and EKG monitoring obtained while on an atypical antipsychotic (BMI: Lipid Panel: HbgA1c: QTc:)              -- Encouraged patient to participate in unit milieu and in scheduled group therapies                            3. Medical Issues Being Addressed:    Hypertension restarted amlodipine   1 to 2 weeks ago will hold until tomorrow given recent cocaine use. Hyperlipidemia we will restart atorvastatin .  Reports was last taken approximately 1 to 2 weeks ago GERD will restart pantoprazole .  Reports was last taken approximately 1 to 2 weeks ago      4. Discharge Planning:   -- Social work and case management to assist with discharge planning and identification of hospital follow-up needs prior to discharge  -- Discharge Fri/Sat pending follow up Will need PCP as well  Fay Hoop, PA-C 02/09/2024, 1:51 PM

## 2024-02-09 NOTE — Group Note (Signed)
 LCSW Group Therapy Note  Group Date: 02/09/2024 Start Time: 1300 End Time: 1410   Type of Therapy and Topic:  Group Therapy - Healthy vs Unhealthy Coping Skills  Participation Level:  Active   Description of Group The focus of this group was to determine what unhealthy coping techniques typically are used by group members and what healthy coping techniques would be helpful in coping with various problems. Patients were guided in becoming aware of the differences between healthy and unhealthy coping techniques. Patients were asked to identify 2-3 healthy coping skills they would like to learn to use more effectively.  Therapeutic Goals Patients learned that coping is what human beings do all day long to deal with various situations in their lives Patients defined and discussed healthy vs unhealthy coping techniques Patients identified their preferred coping techniques and identified whether these were healthy or unhealthy Patients determined 2-3 healthy coping skills they would like to become more familiar with and use more often. Patients provided support and ideas to each other   Summary of Patient Progress:    During the group session, the patient communicated openly and showed receptiveness to feedback from both peers and the CSW. They demonstrated a strong understanding of the topic, maintained respect for others, and actively participated throughout the entire session. The patient engaged in a writing exercise designed to encourage healthy expression, collaboration, and mindfulness. They shared personal experiences and offered thoughtful, constructive reflections.   Therapeutic Modalities Cognitive Behavioral Therapy Motivational Interviewing  Monique Key 02/09/2024  2:23 PM

## 2024-02-09 NOTE — Group Note (Signed)
 Date:  02/09/2024 Time:  8:58 PM  Group Topic/Focus:  Wrap-Up Group:   The focus of this group is to help patients review their daily goal of treatment and discuss progress on daily workbooks.    Participation Level:  Did Not Attend  Participation Quality:  NONE  Affect:  NONE  Cognitive:  NONE  Insight: None  Engagement in Group:  NONE  Modes of Intervention:  NONE  Additional Comments:  NONE  Monique Key 02/09/2024, 8:58 PM

## 2024-02-09 NOTE — Plan of Care (Signed)
   Problem: Education: Goal: Knowledge of [facility] Education information/materials will improve Outcome: Progressing Goal: Emotional status will improve Outcome: Progressing Goal: Mental status will improve Outcome: Progressing

## 2024-02-09 NOTE — Group Note (Signed)
 Date:  02/09/2024 Time:  10:06 AM  Group Topic/Focus:  Early Warning Signs:   The focus of this group is to help patients identify signs or symptoms they exhibit before slipping into an unhealthy state or crisis.    Participation Level:  Did Not Attend   Monique Key Rocio Roam 02/09/2024, 10:06 AM

## 2024-02-09 NOTE — Plan of Care (Signed)
  Problem: Education: Goal: Emotional status will improve Outcome: Progressing Goal: Verbalization of understanding the information provided will improve Outcome: Progressing   Problem: Activity: Goal: Interest or engagement in activities will improve Outcome: Progressing   Problem: Coping: Goal: Ability to verbalize frustrations and anger appropriately will improve Outcome: Progressing   Problem: Health Behavior/Discharge Planning: Goal: Identification of resources available to assist in meeting health care needs will improve Outcome: Progressing   Problem: Physical Regulation: Goal: Ability to maintain clinical measurements within normal limits will improve Outcome: Progressing

## 2024-02-09 NOTE — BHH Counselor (Signed)
 CSW spoke with Moira Andrews at [facility]. CSW was informed that she can come in to complete her assessment screening on Wednesday, 02/15/24 at 9:00AM. No other concerns expressed. Contact ended without incident.   CSW met with pt to discuss disposition. Pt shared concerns regarding paying for her storage and needing to get money for that. She talked about potentially going to stay with family members. However, pt also acknowledged that none of these would be ideal due to extenuating circumstances. Pt was notified of acceptance at [facility] but also informed that CSW was not certain that she could remain here until that date. Pt was informed that CSW would ask. Pt shared that she was going to call her daughter to see if she could make some arrangements to get her storage unit paid for. No other concerns expressed. Contact ended without incident.   CSW spoke with provider, pt cannot remain in hospital until [facility] screening date.   CSW informed pt. She was given list of substance use resources. She was informed that discharge is set for tomorrow or Saturday. She stated that she did not know what she was going to do. CSW offered worst case scenario pt could go to [facility]. This was discussed briefly. Pt shared that she was contacting programs to see if she could get in. She voiced agreement to going to [facility]. She stated that she was going to make some more phone calls and try to get in touch with her daughter. No other concerns expressed. Contact ended without incident.   Elvie Hammed. Johnnye Nancy, MSW, LCSW, LCAS 02/09/2024 3:34 PM

## 2024-02-09 NOTE — Group Note (Signed)
 Recreation Therapy Group Note   Group Topic:Healthy Support Systems  Group Date: 02/09/2024 Start Time: 1000 End Time: 1050 Facilitators: Deatrice Factor, LRT, CTRS Location: Craft Room  Group Description: Straw Bridge. In groups or individually, patients were given 10 plastic drinking straws and an equal length of masking tape. Using the materials provided, patients were instructed to build a free-standing bridge-like structure to suspend an everyday item (ex: deck of cards) off the floor or table surface. All materials were required to be used in Secondary school teacher. LRT facilitated post-activity discussion reviewing the importance of having strong and healthy support systems in our lives. LRT discussed how the people in our lives serve as the tape and the deck of cards we placed on top of our straw structure are the stressors we face in daily life. LRT and pts discussed what happens in our life when things get too heavy for us , and we don't have strong supports outside of the hospital. Pt shared 2 of their healthy supports in their life aloud in the group.   Goal Area(s) Addressed:  Patient will identify 2 healthy supports in their life. Patient will identify skills to successfully complete activity. Patient will identify correlation of this activity to life post-discharge.  Patient will build on frustration tolerance skills. Patient will increase team building and communication skills.    Affect/Mood: N/A   Participation Level: Did not attend    Clinical Observations/Individualized Feedback: Patient did not attend group.   Plan: Continue to engage patient in RT group sessions 2-3x/week.   Deatrice Factor, LRT, CTRS 02/09/2024 11:44 AM

## 2024-02-10 MED ORDER — ARIPIPRAZOLE 5 MG PO TABS
5.0000 mg | ORAL_TABLET | Freq: Every day | ORAL | 0 refills | Status: AC
Start: 2024-02-11 — End: ?

## 2024-02-10 MED ORDER — HYDROXYZINE HCL 25 MG PO TABS: 25.0000 mg | ORAL_TABLET | Freq: Three times a day (TID) | ORAL | 0 refills | Status: AC | PRN

## 2024-02-10 MED ORDER — SERTRALINE HCL 50 MG PO TABS: 50.0000 mg | ORAL_TABLET | Freq: Every day | ORAL | 0 refills | Status: AC

## 2024-02-10 MED ORDER — AMLODIPINE BESYLATE 5 MG PO TABS
5.0000 mg | ORAL_TABLET | Freq: Every day | ORAL | 0 refills | Status: AC
Start: 2024-02-11 — End: ?

## 2024-02-10 MED ORDER — ATORVASTATIN CALCIUM 20 MG PO TABS: 20.0000 mg | ORAL_TABLET | Freq: Every day | ORAL | 0 refills | Status: AC

## 2024-02-10 NOTE — Discharge Summary (Signed)
 Physician Discharge Summary Note  Patient:  Monique Key is an 63 y.o., female MRN:  [id number] DOB:  1961-07-03 Patient phone:  [phone] (home)  Patient address:   [address] [address] [place] [address],    Date of Admission:  02/05/2024 Date of Discharge: 02/10/2024  Reason for Admission:  The principal reasons for hospitalization were they Presented to the ED on 02/05/2024  8:52 AM for SI , cocaine use and alcohol abuse. She carries the psychiatric diagnoses of bipolar dx, ETOH, MDD, polysubstance abuse on arrival patient reported SI, requested detox, and noted auditory hallucinations.  On interview patient was alert and oriented x 3.  Patient indicated that they had worsening depression over the past month with significant exacerbation over the past 2 weeks since becoming homeless. Also noted significant cocaine use on admission.   Principal Problem: Suicidal ideation Discharge Diagnoses: Principal Problem:   Suicidal ideation Active Problems:   Alcohol use disorder, severe, dependence (HCC)   Cocaine use disorder, severe, dependence (HCC)   Auditory hallucination   Bipolar disorder (HCC)   Past Psychiatric History: Information collected from patient and chart review   Prev Dx/Sx: Bipolar disorder, suicidal ideation, alcohol abuse, homelessness Current Psych Provider: Denies having 1 currently Home Meds (current): Yes but is noncompliant Previous Med Trials: see above Therapy: Unknown   Prior Psych Hospitalization: initially denied but has said yes on chart review prior Self Harm: Yes Prior Violence: Unknown   Family Psych History: Unknown Family Hx suicide: Unknown   Social History:  Developmental Hx: See chart Educational Hx: some college Occupational Hx: disability Legal Hx: Unknown Living Situation: Currently homeless Spiritual Hx: Unknown Access to weapons/lethal means: Denies access to guns   Substance History Alcohol: Drinks daily Type of alcohol  Beer/liqour Last Drink 02/04/2024 Number of drinks per day half of 1/5 History of alcohol withdrawal seizures none History of DT's none Tobacco: Yes Illicit drugs: Marijuana, cocaine Prescription drug abuse: none Rehab hx: none   Social History:  Social History   Substance and Sexual Activity  Alcohol Use Yes     Social History   Substance and Sexual Activity  Drug Use Not on file    Social History   Socioeconomic History   Marital status: Single    Spouse name: Not on file   Number of children: Not on file   Years of education: Not on file   Highest education level: Not on file  Occupational History   Not on file  Tobacco Use   Smoking status: Every Day    Current packs/day: 0.50    Average packs/day: 0.5 packs/day for 20.0 years (10.0 ttl pk-yrs)    Types: Cigarettes   Smokeless tobacco: Never  Vaping Use   Vaping status: Never Used  Substance and Sexual Activity   Alcohol use: Yes   Drug use: Not on file   Sexual activity: Not on file  Other Topics Concern   Not on file  Social History Narrative   Not on file   Social Drivers of Health   Financial Resource Strain: Not on file  Food Insecurity: Food Insecurity Present (02/06/2024)   Hunger Vital Sign    Worried About Running Out of Food in the Last Year: Sometimes true    Ran Out of Food in the Last Year: Sometimes true  Transportation Needs: Unmet Transportation Needs (02/06/2024)   PRAPARE - Administrator, Civil Service (Medical): Yes    Lack of Transportation (Non-Medical): Yes  Physical Activity: Not  on file  Stress: Not on file  Social Connections: Socially Isolated (02/06/2024)   Social Connection and Isolation Panel [NHANES]    Frequency of Communication with Friends and Family: Once a week    Frequency of Social Gatherings with Friends and Family: Once a week    Attends Religious Services: 1 to 4 times per year    Active Member of [employer] or Organizations: No    Attends Tax inspector Meetings: Never    Marital Status: Never married   Past Medical History: History reviewed. No pertinent past medical history. History reviewed. No pertinent surgical history. Family History: History reviewed. No pertinent family history.  Hospital Course:  During the course of hospitalization, pt received daily multiple modalities of treatments consisting of Psychopharmacology, individual, group, psychoeducational, recreational, milieu therapy, including case management to coordinate pts inpatient and outpatient care and in concert with weekly treatment team meetings. Discharge planning was initiated on the day of admission to ensure a safe discharge. The presenting symptoms were closely monitored and medications were started as indicated. There were no complications. The principal reasons for hospitalization were they Presented to the ED on 02/05/2024  8:52 AM for SI , cocaine use and alcohol abuse. She carries the psychiatric diagnoses of bipolar dx, ETOH, MDD, polysubstance abuse on arrival patient reported SI, requested detox, and noted auditory hallucinations.  On interview patient was alert and oriented x 3.  Patient indicated that they had worsening depression over the past month with significant exacerbation over the past 2 weeks since becoming homeless. Also noted significant cocaine use on admission.   Medications addressing the principal problem were initiated with improvement in severity sufficient to discharge to a lower level of care. Patient was non compliant with home medications and was restarted on abilify  5 mg and zoloft  50 mg which she tolerated well through out the admission. They were also restarted on medical meds protonix  amlodipine , and atorvostatin. Per med rec did not require protonix  script at DC was provided with other medications and encouraged to follow up with PCP for management of medical medications and with scheduled follow up for psychiatric medications. PT was  also placed on CIWA protocol for alcohol withdrawal but did not require librium  for elevated scores throughout admission. They also utilized PRN hydroxyzine  for anxiety. It is intended for the outpatient provider to determine whether to continue these medications, or if these medication needs to be titrated for continued outpatient therapy. All identified psychiatric, general medical/surgical psychosocial obstacles to discharge were addressed. Patient tolerated these medications with no noted side effects. All these medications were titrated to discharge levels (Please see discharge medications below). Patient showed sustained symptomatic improvement before discharge. The patient denied suicidal, homicidal ideations and hallucinations. Collateral contacted to determine baseline behaviors and for safe discharge plan. Daughter was contacted by social work and plans to meet up with patient in [place] on the day of discharge. Patient to discharge to [facility].  On the day of discharge, following sustained improvement in the affect of this patient, continued report of euthymic mood, repeated denial of suicidal, homicidal and other violent ideations, adequate interaction with peers, active participation in groups while on the unit, and denial of adverse reactions from the medications, the treatment team decided that Pt was stable for discharge with scheduled mental health treatment as below. A comprehensive risk assessment was done prior to discharge and shows that patient is at low risk for suicide or violence and will continue to be if patient complies  with the treatment recommendations, medications and therapy. At the time of discharge, patient no longer meeting criteria for IVC, patient is not an imminent danger to self or others. patient agrees to call Crisis Services, 911 and/or return to the ED if safety cannot be maintained outside the hospital setting. Discharge medications reviewed with patient, explanation of  indication, risks/benefits and side effects profiles. The patient verbalized understanding and is in agreement with the discharge plan.    Physical Findings: AIMS:  , ,  ,  ,    CIWA:  CIWA-Ar Total: 0 COWS:  COWS Total Score: 0     Psychiatric Specialty Exam:  Presentation  General Appearance:  Casual  Eye Contact: Fair  Speech: Clear and Coherent  Speech Volume: Normal    Mood and Affect  Mood: Euthymic  Affect: Congruent   Thought Process  Thought Processes: Coherent  Descriptions of Associations:Intact  Orientation:Full (Time, Place and Person)  Thought Content:Logical  Hallucinations:Hallucinations: None  Ideas of Reference:None  Suicidal Thoughts:Suicidal Thoughts: No  Homicidal Thoughts:Homicidal Thoughts: No   Sensorium  Memory: Immediate Fair; Recent Fair  Judgment: Fair  Insight: Fair   Art therapist  Concentration: Fair  Attention Span: Fair  Recall: [employer] of Knowledge: Fair  Language: Fair   Psychomotor Activity  Psychomotor Activity: Psychomotor Activity: Normal  Musculoskeletal: Strength & Muscle Tone: within normal limits Gait & Station: normal Assets  Assets: Manufacturing systems engineer; Desire for Improvement   Sleep  Sleep: Sleep: Fair    Physical Exam: Physical Exam Vitals and nursing note reviewed.  HENT:     Head: Atraumatic.  Eyes:     Extraocular Movements: Extraocular movements intact.  Pulmonary:     Effort: Pulmonary effort is normal.  Neurological:     Mental Status: She is alert and oriented to person, place, and time. Mental status is at baseline.  Psychiatric:        Mood and Affect: Mood normal.        Behavior: Behavior normal.        Thought Content: Thought content normal.        Judgment: Judgment normal.    Review of Systems  Neurological:  Negative for tremors.  Psychiatric/Behavioral:  Negative for hallucinations, substance abuse and suicidal ideas. The  patient is not nervous/anxious.    Blood pressure 128/68, pulse 78, temperature 98.6 F (37 C), resp. rate 17, height 5\' 6"  (1.676 m), weight 58.1 kg, SpO2 96%. Body mass index is 20.66 kg/m.   Social History   Tobacco Use  Smoking Status Every Day   Current packs/day: 0.50   Average packs/day: 0.5 packs/day for 20.0 years (10.0 ttl pk-yrs)   Types: Cigarettes  Smokeless Tobacco Never   Tobacco Cessation:  A prescription for an FDA-approved tobacco cessation medication was offered at discharge and the patient refused   Blood Alcohol level:  Lab Results  Component Value Date   [facility] <15 02/05/2024    Metabolic Disorder Labs:  Lab Results  Component Value Date   HGBA1C 5.4 02/06/2024   MPG 108.28 02/06/2024   Lab Results  Component Value Date   PROLACTIN 7.2 01/14/2010   Lab Results  Component Value Date   CHOL 192 02/06/2024   TRIG 111 02/06/2024   HDL 51 02/06/2024   CHOLHDL 3.8 02/06/2024   VLDL 22 02/06/2024   LDLCALC 119 (H) 02/06/2024    See Psychiatric Specialty Exam and Suicide Risk Assessment completed by Attending Physician prior to discharge.  Discharge destination:  Other:  IRC with plan to follow up for residential treament in the future.   Is patient on multiple antipsychotic therapies at discharge:  No   Has Patient had three or more failed trials of antipsychotic monotherapy by history:  No  Recommended Plan for Multiple Antipsychotic Therapies: NA  Discharge Instructions     Diet - low sodium heart healthy   Complete by: As directed    Increase activity slowly   Complete by: As directed       Allergies as of 02/10/2024       Reactions   Multihance  [gadobenate] Itching   MRI contrast   Coconut Fatty Acid Hives        Medication List     STOP taking these medications    hydrOXYzine  25 MG capsule Commonly known as: VISTARIL    meloxicam 15 MG tablet Commonly known as: MOBIC   pantoprazole  40 MG tablet Commonly known as:  PROTONIX    Vitamin D (Ergocalciferol) 1.25 MG (50000 UNIT) Caps capsule Commonly known as: DRISDOL       TAKE these medications      Indication  amLODipine  5 MG tablet Commonly known as: NORVASC  Take 1 tablet (5 mg total) by mouth daily. Start taking on: Feb 11, 2024 What changed:  medication strength how much to take when to take this  Indication: High Blood Pressure   ARIPiprazole  5 MG tablet Commonly known as: ABILIFY  Take 1 tablet (5 mg total) by mouth daily. Start taking on: Feb 11, 2024 What changed:  medication strength how much to take  Indication: MIXED BIPOLAR AFFECTIVE DISORDER   atorvastatin  20 MG tablet Commonly known as: LIPITOR Take 1 tablet (20 mg total) by mouth daily.  Indication: Nonfamilial Hypercholesterolemia   hydrOXYzine  25 MG tablet Commonly known as: ATARAX  Take 1 tablet (25 mg total) by mouth every 8 (eight) hours as needed for anxiety.  Indication: Feeling Anxious   sertraline  50 MG tablet Commonly known as: ZOLOFT  Take 1 tablet (50 mg total) by mouth daily. Start taking on: Feb 11, 2024  Indication: Generalized Anxiety Disorder, Major Depressive Disorder        Follow-up Information     [facility] Follow up.   Why: Your appointment is scheduled for 02/16/24 at 1:30 PM via the phone. You will receive a call at [phone]. Contact information: [address]  [address] [place] [address] [phone]                 Follow-up recommendations:  # It is recommended to the patient to continue psychiatric medications as prescribed, after discharge from the hospital.   # It is recommended to the patient to follow up with your outpatient psychiatric provider and PCP. # It was discussed with the patient, the impact of alcohol, drugs, tobacco have been there overall psychiatric and medical wellbeing, and total abstinence from substance use was recommended. # Prescriptions provided or sent directly to preferred pharmacy at  discharge. Patient agreeable to plan. Given the opportunity to ask questions. Appears to feel comfortable with discharge.  # In the event of worsening symptoms, the patient is instructed to call the crisis hotline (988), 911 and or go to the nearest ED for appropriate evaluation and treatment of symptoms. To follow-up with primary care provider for other medical issues, concerns and or health care needs # Patient was discharged home as requested with a plan to follow up as noted above.      Signed: Fay Hoop, PA-C 02/10/2024,  4:08 PM

## 2024-02-10 NOTE — Group Note (Signed)
 Date:  02/10/2024 Time:  11:22 AM  Group Topic/Focus:  Identifying Needs:   The focus of this group is to help patients identify their personal needs that have been historically problematic and identify healthy behaviors to address their needs.    Participation Level:  Did Not Attend   Marianna Shirk Denarius Sesler 02/10/2024, 11:22 AM

## 2024-02-10 NOTE — Progress Notes (Signed)
   02/10/24 0900  Psych Admission Type (Psych Patients Only)  Admission Status Voluntary  Psychosocial Assessment  Patient Complaints Other (Comment) (nightmares)  Eye Contact Fair  Facial Expression Flat  Affect Appropriate to circumstance  Speech Logical/coherent  Interaction Minimal  Motor Activity Slow  Appearance/Hygiene Unremarkable  Behavior Characteristics Cooperative;Appropriate to situation  Mood Pleasant  Thought Process  Coherency WDL  Content WDL  Delusions None reported or observed  Perception WDL  Hallucination None reported or observed  Judgment Impaired  Confusion None  Danger to Self  Current suicidal ideation? Denies

## 2024-02-10 NOTE — Progress Notes (Signed)
 Discharge Note:  Patient denies SI/HI/AVH at this time. Discharge instructions, AVS, prescriptions, and transition record gone over with patient. Patient agrees to comply with medication management, follow-up visit, and outpatient therapy. Patient belongings returned to patient. Patient questions and concerns addressed and answered. Pt will dc via cab

## 2024-02-10 NOTE — [facility] Suicide Risk Assessment (Signed)
 [facility] Discharge Suicide Risk Assessment   Principal Problem: Suicidal ideation Discharge Diagnoses: Principal Problem:   Suicidal ideation Active Problems:   Alcohol use disorder, severe, dependence (HCC)   Cocaine use disorder, severe, dependence (HCC)   Auditory hallucination   Bipolar disorder (HCC)   Total Time spent with patient: 30 minutes  Musculoskeletal: Strength & Muscle Tone: within normal limits Gait & Station: normal Patient leans: N/A  Psychiatric Specialty Exam  Presentation  General Appearance:  Casual  Eye Contact: Fair  Speech: Clear and Coherent  Speech Volume: Normal  Handedness: Right   Mood and Affect  Mood: Euthymic  Duration of Depression Symptoms: No data recorded Affect: Congruent   Thought Process  Thought Processes: Coherent  Descriptions of Associations:Intact  Orientation:Full (Time, Place and Person)  Thought Content:Logical  History of Schizophrenia/Schizoaffective disorder:No data recorded Duration of Psychotic Symptoms:No data recorded Hallucinations:Hallucinations: None  Ideas of Reference:None  Suicidal Thoughts:Suicidal Thoughts: No  Homicidal Thoughts:Homicidal Thoughts: No   Sensorium  Memory: Immediate Fair; Recent Fair  Judgment: Fair  Insight: Fair   Art therapist  Concentration: Fair  Attention Span: Fair  Recall: [employer] of Knowledge: Fair  Language: Fair   Psychomotor Activity  Psychomotor Activity: Psychomotor Activity: Normal   Assets  Assets: Communication Skills; Desire for Improvement   Sleep  Sleep: Sleep: Fair   Physical Exam: Physical Exam Vitals and nursing note reviewed.  HENT:     Head: Atraumatic.  Eyes:     Extraocular Movements: Extraocular movements intact.  Pulmonary:     Effort: Pulmonary effort is normal.  Neurological:     Mental Status: She is alert and oriented to person, place, and time.    Review of Systems   Psychiatric/Behavioral:  Negative for depression, hallucinations, substance abuse and suicidal ideas. The patient is not nervous/anxious and does not have insomnia.    Blood pressure 129/71, pulse 76, temperature 98.6 F (37 C), resp. rate 17, height 5\' 6"  (1.676 m), weight 58.1 kg, SpO2 96%. Body mass index is 20.66 kg/m.  Mental Status Per Nursing Assessment::   On Admission:  NA  Demographic Factors:  Low socioeconomic status  Loss Factors: Financial problems/change in socioeconomic status  Historical Factors: Impulsivity  Risk Reduction Factors:   Positive social support, Positive therapeutic relationship, and Positive coping skills or problem solving skills  Continued Clinical Symptoms:  Alcohol/Substance Abuse/Dependencies Depression 1/10  Cognitive Features That Contribute To Risk:  None    Suicide Risk:  Minimal: No identifiable suicidal ideation.  Patients presenting with no risk factors but with morbid ruminations; may be classified as minimal risk based on the severity of the depressive symptoms    Plan Of Care/Follow-up recommendations:  # It is recommended to the patient to continue psychiatric medications as prescribed, after discharge from the hospital.   # It is recommended to the patient to follow up with your outpatient psychiatric provider and PCP. # It was discussed with the patient, the impact of alcohol, drugs, tobacco have been there overall psychiatric and medical wellbeing, and total abstinence from substance use was recommended. # Prescriptions provided or sent directly to preferred pharmacy at discharge. Patient agreeable to plan. Given the opportunity to ask questions. Appears to feel comfortable with discharge.  # In the event of worsening symptoms, the patient is instructed to call the crisis hotline (988), 911 and or go to the nearest ED for appropriate evaluation and treatment of symptoms. To follow-up with primary care provider for other  medical issues,  concerns and or health care needs # Patient was discharged home as requested with a plan to follow up as noted above.    Fay Hoop, PA-C 02/10/2024, 8:59 AM

## 2024-02-10 NOTE — Plan of Care (Signed)

## 2024-02-10 NOTE — Plan of Care (Signed)
  Problem: Activity: Goal: Sleeping patterns will improve Outcome: Progressing   

## 2024-02-10 NOTE — Progress Notes (Signed)
  [facility] Adult Case Management Discharge Plan :  Will you be returning to the same living situation after discharge:  Yes,  returning to [facility] At discharge, do you have transportation home?: Yes,  pt was given taxi voucher.  Do you have the ability to pay for your medications: Yes,  [place] MEDICAID PREPAID HEALTH PLAN / [place] MEDICAID [facility] COMMUNITY  Release of information consent forms completed and in the chart;  Patient's signature needed at discharge.  Patient to Follow up at:  Follow-up Information     [facility] Follow up.   Why: Your appointment is scheduled for 02/16/24 at 1:30 PM via the phone. You will receive a call at [phone]. Contact information: [address]  [address] [place] [address] [phone]                 Next level of care provider has access to [facility] Link:no  Safety Planning and Suicide Prevention discussed: Yes,  SPE completed with daughter, Nariyah Osias.     Has patient been referred to the Quitline?: Patient refused referral for treatment  Patient has been referred for addiction treatment: Yes, the patient will follow up with an outpatient provider for substance use disorder. Psychiatrist/APP: appointment made and Therapist: appointment made  Randolm Butte, LCSW 02/10/2024, 9:44 AM

## 2024-05-28 ENCOUNTER — Ambulatory Visit: Admitting: Podiatry
